# Patient Record
Sex: Male | Born: 1995 | Race: Black or African American | Hispanic: No | Marital: Single | Smoking: Current every day smoker
Health system: Southern US, Community
[De-identification: ages and names within clinical notes are randomized; demographics above are authoritative.]

## PROBLEM LIST (undated history)

## (undated) ENCOUNTER — Emergency Department: Admission: EM | Discharge status: Absent without leave | Payer: Self-pay

---
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
## 2015-03-20 ENCOUNTER — Encounter: Payer: Self-pay | Admitting: Emergency Medicine

## 2015-03-20 ENCOUNTER — Inpatient Hospital Stay
Admit: 2015-03-20 | Discharge: 2015-03-22 | DRG: 897 | Disposition: A | Discharge status: Home / Self Care | Payer: No Typology Code available for payment source | Source: Intra-hospital | Attending: Psychiatry | Admitting: Psychiatry

## 2015-03-20 ENCOUNTER — Emergency Department
Admission: EM | Admit: 2015-03-20 | Discharge: 2015-03-20 | Disposition: A | Discharge status: Other Acute Inpatient Hospital | Payer: No Typology Code available for payment source | Attending: Emergency Medicine | Admitting: Emergency Medicine

## 2015-03-20 DIAGNOSIS — F17200 Nicotine dependence, unspecified, uncomplicated: Secondary | ICD-10-CM | POA: Diagnosis present

## 2015-03-20 DIAGNOSIS — R451 Restlessness and agitation: Secondary | ICD-10-CM | POA: Diagnosis present

## 2015-03-20 DIAGNOSIS — Z720 Tobacco use: Secondary | ICD-10-CM | POA: Insufficient documentation

## 2015-03-20 DIAGNOSIS — F19959 Other psychoactive substance use, unspecified with psychoactive substance-induced psychotic disorder, unspecified: Principal | ICD-10-CM | POA: Diagnosis present

## 2015-03-20 DIAGNOSIS — F1220 Cannabis dependence, uncomplicated: Secondary | ICD-10-CM | POA: Diagnosis present

## 2015-03-20 DIAGNOSIS — Z7289 Other problems related to lifestyle: Secondary | ICD-10-CM | POA: Diagnosis not present

## 2015-03-20 DIAGNOSIS — Z638 Other specified problems related to primary support group: Secondary | ICD-10-CM | POA: Diagnosis not present

## 2015-03-20 DIAGNOSIS — F29 Unspecified psychosis not due to a substance or known physiological condition: Secondary | ICD-10-CM | POA: Insufficient documentation

## 2015-03-20 DIAGNOSIS — G4700 Insomnia, unspecified: Secondary | ICD-10-CM | POA: Diagnosis present

## 2015-03-20 DIAGNOSIS — F2081 Schizophreniform disorder: Secondary | ICD-10-CM | POA: Diagnosis present

## 2015-03-20 DIAGNOSIS — F19951 Other psychoactive substance use, unspecified with psychoactive substance-induced psychotic disorder with hallucinations: Secondary | ICD-10-CM | POA: Diagnosis present

## 2015-03-20 DIAGNOSIS — F1210 Cannabis abuse, uncomplicated: Secondary | ICD-10-CM | POA: Insufficient documentation

## 2015-03-20 DIAGNOSIS — R440 Auditory hallucinations: Secondary | ICD-10-CM | POA: Diagnosis present

## 2015-03-20 LAB — ETHANOL: Alcohol, Ethyl (B): <5 mg/dL (ref <5)

## 2015-03-20 LAB — CBC
HCT: 47.8 % (ref 40.0–52.0)
HEMOGLOBIN: 16 g/dL (ref 13.0–18.0)
MCH: 28.5 pg (ref 26.0–34.0)
MCHC: 33.5 g/dL (ref 32.0–36.0)
MCV: 85.2 fL (ref 80.0–100.0)
Platelets: 272 10*3/uL (ref 150–440)
RBC: 5.61 MIL/uL (ref 4.40–5.90)
RDW: 14.2 % (ref 11.5–14.5)
WBC: 5.4 10*3/uL (ref 3.8–10.6)

## 2015-03-20 LAB — COMPREHENSIVE METABOLIC PANEL
ALK PHOS: 57 U/L (ref 38–126)
ALT: 19 U/L (ref 17–63)
AST: 26 U/L (ref 15–41)
Albumin: 4.9 g/dL (ref 3.5–5.0)
Anion gap: 11 (ref 5–15)
BUN: 11 mg/dL (ref 6–20)
CHLORIDE: 102 mmol/L (ref 101–111)
CO2: 27 mmol/L (ref 22–32)
CREATININE: 0.91 mg/dL (ref 0.61–1.24)
Calcium: 9.6 mg/dL (ref 8.9–10.3)
Glucose, Bld: 77 mg/dL (ref 65–99)
Potassium: 3.8 mmol/L (ref 3.5–5.1)
Sodium: 140 mmol/L (ref 135–145)
Total Bilirubin: 0.9 mg/dL (ref 0.3–1.2)
Total Protein: 8.1 g/dL (ref 6.5–8.1)

## 2015-03-20 LAB — ACETAMINOPHEN LEVEL: Acetaminophen (Tylenol), Serum: <10 ug/mL — ABNORMAL LOW (ref 10–30)

## 2015-03-20 LAB — URINE DRUG SCREEN, QUALITATIVE (ARMC ONLY)
Amphetamines, Ur Screen: NOT DETECTED
Barbiturates, Ur Screen: NOT DETECTED
Benzodiazepine, Ur Scrn: NOT DETECTED
CANNABINOID 50 NG, UR ~~LOC~~: NOT DETECTED
COCAINE METABOLITE, UR ~~LOC~~: NOT DETECTED
MDMA (ECSTASY) UR SCREEN: NOT DETECTED
Methadone Scn, Ur: NOT DETECTED
Opiate, Ur Screen: NOT DETECTED
PHENCYCLIDINE (PCP) UR S: NOT DETECTED
TRICYCLIC, UR SCREEN: NOT DETECTED

## 2015-03-20 LAB — SALICYLATE LEVEL: Salicylate Lvl: <4 mg/dL (ref 2.8–30.0)

## 2015-03-20 NOTE — ED Notes (Signed)
Pt here with IVC papers, denies any SI, HI or hallucinations. IVC papers state pt lays in street, talks to himself, thinks he is on drugs. Pt denies.

## 2015-03-20 NOTE — ED Notes (Signed)
Pt. Noted in rest room. No complaints or concerns voiced. No distress or abnormal behavior noted. Will continue to monitor with security cameras. Q 15 minute rounds continue.  

## 2015-03-20 NOTE — BH Assessment (Signed)
Assessment Note  Dylan KaufmannJamal M Roy is an 19 y.o. male. He states "I was brought by the police". He states that his grand mother believed he needed to be here.  He reports that he was a sleep all day. He reports that his grandmother stated that he was talking to himself and doing things out of the ordinary. He denied symptoms of depression or anxiety. He reports occasionally he has hallucinations, but then looked away and changed his answer. He denied having homicidal or suicidal ideation or intent. He denied use of alchol. He reports the use of marijuana.  His IVC paperwork states that he "Lays in the street, his mother thinks he is on drugs, He cannot keep a job, talks to himself". He appears to be responding to internal stimuli, looking away, listening and laughing at inapporpriate times.  Diagnosis:  Past Medical History: History reviewed. No pertinent past medical history.  History reviewed. No pertinent past surgical history.  Family History: History reviewed. No pertinent family history.  Social History:  reports that he has been smoking.  He does not have any smokeless tobacco history on file. He reports that he drinks alcohol. He reports that he uses illicit drugs (Marijuana).  Additional Social History:  Alcohol / Drug Use History of alcohol / drug use?: Yes Negative Consequences of Use:  (Denied any negative consequences) Substance #1 Name of Substance 1: Marijuana 1 - Age of First Use: 14 1 - Amount (size/oz): 1-2 blunts 1 - Frequency: every couple of days 1 - Last Use / Amount: 03/17/2015  CIWA: CIWA-Ar Pulse Rate: 60 COWS:    Allergies: No Known Allergies  Home Medications:  (Not in a hospital admission)  OB/GYN Status:  No LMP for male patient.  General Assessment Data [facility] Assessment: In system Is this a Tele or Face-to-Face Assessment?: Face-to-Face Is this an Initial Assessment or a Re-assessment for this encounter?: Initial  Assessment Marital status: Single Maiden name: n/a Is patient pregnant?: No Pregnancy Status: No Living Arrangements: Other relatives Database administrator(Grandmother) Can pt return to current living arrangement?: Yes Admission Status: Involuntary Is patient capable of signing voluntary admission?: Yes Referral Source: Self/Family/Friend Insurance type: None  Medical Screening Exam [facility]-in ONLY) Medical Exam completed: Yes  Crisis Care Plan Living Arrangements: Other relatives Database administrator(Grandmother) Name of Psychiatrist: None Name of Therapist: None  Education Status Is patient currently in school?: No Current Grade: N/a Highest grade of school patient has completed: 11th Name of school: [employer] Contact person: N/a  Risk to self with the past 6 months Suicidal Ideation: No Has patient been a risk to self within the past 6 months prior to admission? : No Suicidal Intent: No Has patient had any suicidal intent within the past 6 months prior to admission? : No Is patient at risk for suicide?: No Suicidal Plan?: No Has patient had any suicidal plan within the past 6 months prior to admission? : No Access to Means: No What has been your use of drugs/alcohol within the last 12 months?: Use of marijuana Previous Attempts/Gestures: No How many times?: 0 Other Self Harm Risks: None reported Triggers for Past Attempts: None known Intentional Self Injurious Behavior: None Family Suicide History: No Recent stressful life event(s):  (None reported) Persecutory voices/beliefs?: No Depression: No Depression Symptoms:  (None reported) Substance abuse history and/or treatment for substance abuse?: Yes Suicide prevention information given to non-admitted patients: Not applicable  Risk to Others within the past 6 months Homicidal Ideation: No  Does patient have any lifetime risk of violence toward others beyond the six months prior to admission? : No Thoughts of Harm to Others: No Current Homicidal  Intent: No Current Homicidal Plan: No Access to Homicidal Means: No Identified Victim: None reported History of harm to others?: No Assessment of Violence: None Noted Violent Behavior Description: None reported Does patient have access to weapons?: No Criminal Charges Pending?: No Does patient have a court date: No Is patient on probation?: No  Psychosis Hallucinations:  (Unsure, states he does but changed his mind) Delusions: None noted  Mental Status Report Appearance/Hygiene: In scrubs, Unremarkable Eye Contact: Poor Motor Activity: Restlessness Speech: Soft, Logical/coherent Level of Consciousness: Alert Mood: Euthymic Affect: Unable to Assess Anxiety Level: None Thought Processes: Coherent Judgement: Partial Orientation: Person, Place, Situation Obsessive Compulsive Thoughts/Behaviors: None  Cognitive Functioning Concentration: Decreased Memory: Recent Intact IQ: Average Insight: Poor Impulse Control: Fair Appetite: Good Sleep: No Change Vegetative Symptoms: Staying in bed  ADLScreening [facility] Assessment Services) Patient's cognitive ability adequate to safely complete daily activities?: Yes Patient able to express need for assistance with ADLs?: Yes Independently performs ADLs?: Yes (appropriate for developmental age)  Prior Inpatient Therapy Prior Inpatient Therapy: No  Prior Outpatient Therapy Prior Outpatient Therapy: No Does patient have an ACCT team?: No Does patient have Intensive In-House Services?  : No Does patient have [facility] services? : No Does patient have P4CC services?: No  ADL Screening (condition at time of admission) Patient's cognitive ability adequate to safely complete daily activities?: Yes Patient able to express need for assistance with ADLs?: Yes Independently performs ADLs?: Yes (appropriate for developmental age)       Abuse/Neglect Assessment (Assessment to be complete while patient is alone) Physical Abuse:  Denies Verbal Abuse: Denies Sexual Abuse: Denies Exploitation of patient/patient's resources: Denies Self-Neglect: Denies Values / Beliefs Cultural Requests During Hospitalization: None Spiritual Requests During Hospitalization: None   Advance Directives (For Healthcare) Does patient have an advance directive?: No Would patient like information on creating an advanced directive?: No - patient declined information    Additional Information 1:1 In Past 12 Months?: No CIRT Risk: No Elopement Risk: No Does patient have medical clearance?: Yes     Disposition:  Disposition Initial Assessment Completed for this Encounter: Yes Disposition of Patient: Inpatient treatment program Type of inpatient treatment program: Adult  On Site Evaluation by:   Reviewed with Physician:    Justice Deeds 03/20/2015 8:46 PM

## 2015-03-20 NOTE — ED Notes (Signed)
Pt to ER under IVC by mother. Per IVC papers pt laying in street, mother thinks pt is doing drugs, and that pt talks to himself. Pt denies SI, HI, ETOH use. Pt does report smoking cigarettes 2-3 cigarettes/day, pt reprots smoking marijuana approximately 5 x/month. Denies other illicit drugs.  Pt does report AH that have been increasing over the past year. Pt reprots having VH since he was a child, but the hallucinations have become "scarier" during the past year.

## 2015-03-20 NOTE — ED Notes (Signed)
Pt. Noted in room. No complaints or concerns voiced. No distress or abnormal behavior noted. Will continue to monitor with security cameras. Q 15 minute rounds continue. 

## 2015-03-20 NOTE — ED Notes (Signed)
Pt placed in subwait with BPD at bedside.

## 2015-03-20 NOTE — ED Notes (Signed)

## 2015-03-20 NOTE — ED Notes (Signed)
[facility] Jillyn HiddenGary L.

## 2015-03-20 NOTE — ED Provider Notes (Signed)
[facility] Emergency Department Provider Note  ____________________________________________  Time seen: Approximately 8 PM  I have reviewed the triage vital signs and the nursing notes.   HISTORY  Chief Complaint Behavior Problem    HPI Cathan Gearin Mandigo is a 19 y.o. male who was committed by his grandmother tonight for odd behavior such as lying in the street and talking to himself. The patient denies any suicidal or homicidal ideation. He says that he does hear voices but is not able to say exactly what they say to him. He does say that he has conversations with them. Does admit to occasional marijuana use but denies any other illicit substance.   History reviewed. No pertinent past medical history.  Patient Active Problem List   Diagnosis Date Noted  . 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 Marijuana abuse 03/20/2015    History reviewed. No pertinent past surgical history.  No current outpatient prescriptions on file.  Allergies Review of patient's allergies indicates no known allergies.  History reviewed. No pertinent family history.  Social History Social History  Substance Use Topics  . Smoking status: Current Every Day Smoker  . Smokeless tobacco: None  . Alcohol Use: Yes    Review of Systems Constitutional: No fever/chills Eyes: No visual changes. ENT: No sore throat. Cardiovascular: Denies chest pain. Respiratory: Denies shortness of breath. Gastrointestinal: No abdominal pain.  No nausea, no vomiting.  No diarrhea.  No constipation. Genitourinary: Negative for dysuria. Musculoskeletal: Negative for back pain. Skin: Negative for rash. Neurological: Negative for headaches, focal weakness or numbness.  10-point ROS otherwise negative.  ____________________________________________   PHYSICAL EXAM:  VITAL SIGNS: ED Triage Vitals  Enc Vitals Group     BP --      Pulse Rate 03/20/15 1802 60     Resp 03/20/15 1802  16     Temp 03/20/15 1802 98 F (36.7 C)     Temp Source 03/20/15 1802 Oral     SpO2 03/20/15 1802 100 %     Weight 03/20/15 1802 152 lb (68.947 kg)     Height 03/20/15 1802  (1.676 m)     Head Cir --      Peak Flow --      Pain Score 03/20/15 1801 0     Pain Loc --      Pain Edu? --      Excl. in GC? --     Constitutional: Alert and oriented. Well appearing and in no acute distress. Eyes: Conjunctivae are normal. PERRL. EOMI. Head: Atraumatic. Nose: No congestion/rhinnorhea. Mouth/Throat: Mucous membranes are moist.  Oropharynx non-erythematous. Neck: No stridor.   Cardiovascular: Normal rate, regular rhythm. Grossly normal heart sounds.  Good peripheral circulation. Respiratory: Normal respiratory effort.  No retractions. Lungs CTAB. Gastrointestinal: Soft and nontender. No distention. No abdominal bruits. No CVA tenderness. Musculoskeletal: No lower extremity tenderness nor edema.  No joint effusions. Neurologic:  Normal speech and language. No gross focal neurologic deficits are appreciated. No gait instability. Skin:  Skin is warm, dry and intact. No rash noted. Psychiatric: Odd affect. Laughing when talking about hearing voices.   ____________________________________________   LABS (all labs ordered are listed, but only abnormal results are displayed)  Labs Reviewed  ACETAMINOPHEN LEVEL - Abnormal; Notable for the following:    Acetaminophen (Tylenol), Serum <10 (*)    All other components within normal limits  COMPREHENSIVE METABOLIC PANEL  ETHANOL  SALICYLATE LEVEL  CBC  URINE DRUG SCREEN, QUALITATIVE ([facility] ONLY)  ____________________________________________  EKG   ____________________________________________  RADIOLOGY   ____________________________________________   PROCEDURES  ____________________________________________   INITIAL IMPRESSION / ASSESSMENT AND PLAN / ED COURSE  Pertinent labs & imaging results that were available during  my care of the patient were reviewed by me and considered in my medical decision making (see chart for details).  ----------------------------------------- 8:34 PM on 03/20/2015 -----------------------------------------  Patient evaluated by Dr. Toni Amendlapacs who is recommending admission to the hospital. ____________________________________________   FINAL CLINICAL IMPRESSION(S) / ED DIAGNOSES  Psychosis     Myrna Blazeravid Matthew Carnell Casamento, MD 03/20/15 2034

## 2015-03-20 NOTE — Consult Note (Signed)
[facility] Consult   Reason for Consult:  Consult for this 19 year old man brought in under involuntary petition taken out by his family Referring Physician:  Clearnce Hasten Patient Identification: Dylan Roy MRN:  [id number] Principal Diagnosis: Schizophreniform disorder [facility]) Diagnosis:   Patient Active Problem List   Diagnosis Date Noted  . 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 Marijuana abuse [F12.10] 03/20/2015    Total Time spent with patient: 45 minutes  Subjective:   Dylan Roy is a 19 y.o. male patient admitted with "I've been hearing some things".  HPI:  Information from the patient and the chart. The referral commitment says that the patient has been hearing voices and talking to himself and lying down in the street. The patient admits that he was lying down in a parking lot. He doesn't have any particular reason for this. He says that he was talking to himself. When pressed he admits that he has been having auditory hallucinations of been going on for several months. He will go into any more detail with me describing them. Says they happen about every other day and sometimes he talks back to them. Patient is not sleeping well at night and instead sleeps during the day. Admits that he uses marijuana a few times a week. Denies using any other drugs. Not currently getting any psychiatric treatment. Patient is very limited in offering any history to me.  Social history: Patient is not in school not working. Lives with his grandmother. Sounds again has a pretty limited social life.  Medical history: No significant medical problems known.  Substance abuse history: Marijuana abuse a few times a week denies other drugs denies alcohol.  Current medications: None  Past Psychiatric History: Patient was treated for attention deficit hyperactivity disorder as a child. Hasn't taken any medicine in several years. No history of suicide attempts no  history of violence no history of psychiatric hospitalization.  Risk to Self: Is patient at risk for suicide?: No Risk to Others:   Prior Inpatient Therapy:   Prior Outpatient Therapy:    Past Medical History: History reviewed. No pertinent past medical history. History reviewed. No pertinent past surgical history. Family History: History reviewed. No pertinent family history. Family Psychiatric  History: Patient denies any family history of mental health or substance abuse problems. Social History:  History  Alcohol Use  . Yes     History  Drug Use  . Yes  . Special: Marijuana    Social History   Social History  . Marital Status: Single    Spouse Name: N/A  . Number of Children: N/A  . Years of Education: N/A   Social History Main Topics  . Smoking status: Current Every Day Smoker  . Smokeless tobacco: None  . Alcohol Use: Yes  . Drug Use: Yes    Special: Marijuana  . Sexual Activity: Not Asked   Other Topics Concern  . None   Social History Narrative  . None   Additional Social History:                          Allergies:  No Known Allergies  Labs:  Results for orders placed or performed during the hospital encounter of 03/20/15 (from the past 48 hour(s))  Comprehensive metabolic panel     Status: None   Collection Time: 03/20/15  6:05 PM  Result Value Ref Range   Sodium 140 135 - 145 mmol/L   Potassium  3.8 3.5 - 5.1 mmol/L   Chloride 102 101 - 111 mmol/L   CO2 27 22 - 32 mmol/L   Glucose, Bld 77 65 - 99 mg/dL   BUN 11 6 - 20 mg/dL   Creatinine, Ser 0.91 0.61 - 1.24 mg/dL   Calcium 9.6 8.9 - 10.3 mg/dL   Total Protein 8.1 6.5 - 8.1 g/dL   Albumin 4.9 3.5 - 5.0 g/dL   AST 26 15 - 41 U/L   ALT 19 17 - 63 U/L   Alkaline Phosphatase 57 38 - 126 U/L   Total Bilirubin 0.9 0.3 - 1.2 mg/dL   GFR calc non Af Amer >60 >60 mL/min   GFR calc Af Amer >60 >60 mL/min    Comment: (NOTE) The eGFR has been calculated using the CKD EPI equation. This  calculation has not been validated in all clinical situations. eGFR's persistently <60 mL/min signify possible Chronic Kidney Disease.    Anion gap 11 5 - 15  Ethanol (ETOH)     Status: None   Collection Time: 03/20/15  6:05 PM  Result Value Ref Range   Alcohol, Ethyl (B) <5 <5 mg/dL    Comment:        LOWEST DETECTABLE LIMIT FOR SERUM ALCOHOL IS 5 mg/dL FOR MEDICAL PURPOSES ONLY   Salicylate level     Status: None   Collection Time: 03/20/15  6:05 PM  Result Value Ref Range   Salicylate Lvl <0.2 2.8 - 30.0 mg/dL  Acetaminophen level     Status: Abnormal   Collection Time: 03/20/15  6:05 PM  Result Value Ref Range   Acetaminophen (Tylenol), Serum <10 (L) 10 - 30 ug/mL    Comment:        THERAPEUTIC CONCENTRATIONS VARY SIGNIFICANTLY. A RANGE OF 10-30 ug/mL MAY BE AN EFFECTIVE CONCENTRATION FOR MANY PATIENTS. HOWEVER, SOME ARE BEST TREATED AT CONCENTRATIONS OUTSIDE THIS RANGE. ACETAMINOPHEN CONCENTRATIONS >150 ug/mL AT 4 HOURS AFTER INGESTION AND >50 ug/mL AT 12 HOURS AFTER INGESTION ARE OFTEN ASSOCIATED WITH TOXIC REACTIONS.   CBC     Status: None   Collection Time: 03/20/15  6:05 PM  Result Value Ref Range   WBC 5.4 3.8 - 10.6 K/uL   RBC 5.61 4.40 - 5.90 MIL/uL   Hemoglobin 16.0 13.0 - 18.0 g/dL   HCT 47.8 40.0 - 52.0 %   MCV 85.2 80.0 - 100.0 fL   MCH 28.5 26.0 - 34.0 pg   MCHC 33.5 32.0 - 36.0 g/dL   RDW 14.2 11.5 - 14.5 %   Platelets 272 150 - 440 K/uL  Urine Drug Screen, Qualitative ([facility] only)     Status: None   Collection Time: 03/20/15  6:05 PM  Result Value Ref Range   Tricyclic, Ur Screen NONE DETECTED NONE DETECTED   Amphetamines, Ur Screen NONE DETECTED NONE DETECTED   MDMA (Ecstasy)Ur Screen NONE DETECTED NONE DETECTED   Cocaine Metabolite,Ur [place] NONE DETECTED NONE DETECTED   Opiate, Ur Screen NONE DETECTED NONE DETECTED   Phencyclidine (PCP) Ur S NONE DETECTED NONE DETECTED   Cannabinoid 50 Ng, Ur [place] NONE DETECTED NONE DETECTED   Barbiturates, Ur  Screen NONE DETECTED NONE DETECTED   Benzodiazepine, Ur Scrn NONE DETECTED NONE DETECTED   Methadone Scn, Ur NONE DETECTED NONE DETECTED    Comment: (NOTE) 542  Tricyclics, urine               Cutoff 1000 ng/mL 200  Amphetamines, urine  Cutoff 1000 ng/mL 300  MDMA (Ecstasy), urine           Cutoff 500 ng/mL 400  Cocaine Metabolite, urine       Cutoff 300 ng/mL 500  Opiate, urine                   Cutoff 300 ng/mL 600  Phencyclidine (PCP), urine      Cutoff 25 ng/mL 700  Cannabinoid, urine              Cutoff 50 ng/mL 800  Barbiturates, urine             Cutoff 200 ng/mL 900  Benzodiazepine, urine           Cutoff 200 ng/mL 1000 Methadone, urine                Cutoff 300 ng/mL 1100 1200 The urine drug screen provides only a preliminary, unconfirmed 1300 analytical test result and should not be used for non-medical 1400 purposes. Clinical consideration and professional judgment should 1500 be applied to any positive drug screen result due to possible 1600 interfering substances. A more specific alternate chemical method 1700 must be used in order to obtain a confirmed analytical result.  1800 Gas chromato graphy / mass spectrometry (GC/MS) is the preferred 1900 confirmatory method.     No current facility-administered medications for this encounter.   No current outpatient prescriptions on file.    Musculoskeletal: Strength & Muscle Tone: within normal limits Gait & Station: normal Patient leans: N/A  Psychiatric Specialty Exam: Review of Systems  Constitutional: Negative.   HENT: Negative.   Eyes: Negative.   Respiratory: Negative.   Cardiovascular: Negative.   Gastrointestinal: Negative.   Musculoskeletal: Negative.   Skin: Negative.   Neurological: Negative.   Psychiatric/Behavioral: Negative for depression, suicidal ideas, hallucinations, memory loss and substance abuse. The patient has insomnia. The patient is not nervous/anxious.     Pulse 60,  temperature 98 F (36.7 C), temperature source Oral, resp. rate 16, height '5\' 6"'  (1.676 m), weight 68.947 kg (152 lb), SpO2 100 %.Body mass index is 24.55 kg/(m^2).  General Appearance: Casual  Eye Contact::  Minimal  Speech:  Slow  Volume:  Decreased  Mood:  Negative  Affect:  Congruent  Thought Process:  Goal Directed  Orientation:  Full (Time, Place, and Person)  Thought Content:  Hallucinations: Auditory  Suicidal Thoughts:  No  Homicidal Thoughts:  No  Memory:  Immediate;   Fair Recent;   Poor Remote;   Poor  Judgement:  Impaired  Insight:  Shallow  Psychomotor Activity:  Decreased  Concentration:  Fair  Recall:  [place]: Fair  Akathisia:  No  Handed:  Right  AIMS (if indicated):     Assets:  Communication Skills Housing Physical Health Social Support  ADL's:  Intact  Cognition: WNL  Sleep:      Treatment Plan Summary: Plan Patient will be admitted into the psychiatric ward of the hospital. There is enough evidence here that he is having psychotic symptoms to make it reasonable to go ahead and admit him for further evaluation. Patient is denying any suicidal ideation. Drug screen negative nothing remarkable on the labs. Counseling completed. Reviewed case with emergency room physician.  Disposition: Recommend psychiatric Inpatient admission when medically cleared. Supportive therapy provided about ongoing stressors.  John Clapacs 03/20/2015 8:25 PM

## 2015-03-20 NOTE — ED Notes (Signed)
Pt in room. No complaints or concerns voiced at this time. No abnormal behavior noted at this time. Will continue to monitor with q15 min checks. ODS officer in area. 

## 2015-03-20 NOTE — ED Notes (Signed)
Pt. To [facility] from ED ambulatory without difficulty, to [facility] 1 . [facility] Pt. Is alert and oriented, warm and dry in no distress. Pt. Denies SI, HI, and AVH. Pt. Calm and cooperative. Pt. Made aware of security cameras and Q15 minute rounds. Pt. Encouraged to let Nursing staff know of any concerns or needs.

## 2015-03-21 DIAGNOSIS — F17200 Nicotine dependence, unspecified, uncomplicated: Secondary | ICD-10-CM | POA: Diagnosis present

## 2015-03-21 DIAGNOSIS — F2081 Schizophreniform disorder: Secondary | ICD-10-CM

## 2015-03-21 LAB — LIPID PANEL
Cholesterol: 150 mg/dL (ref 0–200)
HDL: 45 mg/dL (ref >40)
LDL Cholesterol: 77 mg/dL (ref 0–99)
Total CHOL/HDL Ratio: 3.3 RATIO
Triglycerides: 138 mg/dL (ref <150)
VLDL: 28 mg/dL (ref 0–40)

## 2015-03-21 LAB — HEMOGLOBIN A1C: HEMOGLOBIN A1C: 5.1 % (ref 4.0–6.0)

## 2015-03-21 LAB — TSH: TSH: 4.932 u[IU]/mL — ABNORMAL HIGH (ref 0.350–4.500)

## 2015-03-21 MED ORDER — NICOTINE 21 MG/24HR TD PT24: 21.0000 mg | MEDICATED_PATCH | Freq: Every day | TRANSDERMAL | Status: DC

## 2015-03-21 MED ORDER — TRAZODONE HCL 100 MG PO TABS
100.0000 mg | ORAL_TABLET | Freq: Every day | ORAL | Status: DC
  Filled 2015-03-21: qty 1

## 2015-03-21 MED ORDER — ARIPIPRAZOLE 5 MG PO TABS: 5.0000 mg | ORAL_TABLET | Freq: Every day | ORAL | Status: DC

## 2015-03-21 MED ORDER — MAGNESIUM HYDROXIDE 400 MG/5ML PO SUSP: 30.0000 mL | Freq: Every day | ORAL | Status: DC | PRN

## 2015-03-21 MED ORDER — ALUM & MAG HYDROXIDE-SIMETH 200-200-20 MG/5ML PO SUSP: 30.0000 mL | ORAL | Status: DC | PRN

## 2015-03-21 MED ORDER — ACETAMINOPHEN 325 MG PO TABS: 650.0000 mg | ORAL_TABLET | Freq: Four times a day (QID) | ORAL | Status: DC | PRN

## 2015-03-21 NOTE — Progress Notes (Signed)
Recreation Therapy Notes  Date: 11.08.16 Time: 3:00 pm Location: Craft Room  Group Topic: Goal Setting  Goal Area(s) Addresses:  Patient will write down at least one goal. Patient will write down at least one obstacle.  Behavioral Response: Did not attend  Intervention: Recovery Goal Chart  Activity: Patients were instructed to make a Recovery Goal Chart with goals, obstacles, the date they started working on their goals, and the date they achieved their goals.   Education: LRT educated patients on healthy ways they can celebrate reaching their goals.  Education Outcome: Patient did not attend group.  Clinical Observations/Feedback: Patient did not attend group.  Jacquelynn CreeGreene,Josephene Marrone M, LRT/CTRS 03/21/2015 4:08 PM

## 2015-03-21 NOTE — [facility] Group Notes (Signed)
[facility]  03/21/2015 2:18 PM  Type of Therapy:  Group Therapy  Participation Level:  Did Not Attend   Lulu Ridingngle, Neena Beecham T, MSW, LCSWA 03/21/2015, 2:18 PM

## 2015-03-21 NOTE — Progress Notes (Signed)
Patient with bright affect, smiling and talking with peers. Good appetite and good adls. Patient to am therapy group and smiling and laughing to himself. Patient appears to be responding to internal stimuli. Reported to Clinical research associatewriter that patient attempted to lead group by asking peers " how are you feeling?". Reported to Clinical research associatewriter that patient was overhead stating" I am leaving today and if the Doctor says No I am walking out the door and if anyone touches me while I'm leaving I will sue the hospital". One on one with patient and patient does appear to be responding to internal stimuli during assessment. He is noted to look at chair next to him and wait. Delays response during discussion. Attempts to explain current recommended plan of care with little effect. Patient focused on what staff should do for other patients who may need help. Patient states he "is fine and does not belong here". Denies SI/HI/AVH at this time.

## 2015-03-21 NOTE — [facility] Suicide Risk Assessment (Signed)
[facility] Admission Suicide Risk Assessment   Nursing information obtained from:  Patient Demographic factors:  Adolescent or young adult, Unemployed, Male Current Mental Status:  NA Loss Factors:  NA Historical Factors:  NA Risk Reduction Factors:  Living with another person, especially a relative Total Time spent with patient: 1 hour Principal Problem: Schizophreniform disorder (HCC) Diagnosis:   Patient Active Problem List   Diagnosis Date Noted  . Tobacco use disorder [F17.200] 03/21/2015  . 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 Cannabis use disorder, moderate, dependence (HCC) [F12.20] 03/20/2015     Continued Clinical Symptoms:  Alcohol Use Disorder Identification Test Final Score (AUDIT): 0 The "Alcohol Use Disorders Identification Test", Guidelines for Use in Primary Care, Second Edition.  World Science writer [facility]). Score between 0-7:  no or low risk or alcohol related problems. Score between 8-15:  moderate risk of alcohol related problems. Score between 16-19:  high risk of alcohol related problems. Score 20 or above:  warrants further diagnostic evaluation for alcohol dependence and treatment.   CLINICAL FACTORS:   Alcohol/Substance Abuse/Dependencies Schizophrenia:   Command hallucinatons Less than 73 years old Paranoid or undifferentiated type   Musculoskeletal: Strength & Muscle Tone: within normal limits Gait & Station: normal Patient leans: N/A  Psychiatric Specialty Exam: I reviewed physical exam performed in the emergency room and they agree with the findings Physical Exam  Nursing note and vitals reviewed.   Review of Systems  Psychiatric/Behavioral: Positive for hallucinations and substance abuse.  All other systems reviewed and are negative.   Blood pressure 113/69, pulse 64, temperature 98 F (36.7 C), temperature source Oral, resp. rate 18, height  (1.676 m), weight 66.906 kg (147 lb 8 oz), SpO2 100 %.Body mass index is  23.82 kg/(m^2).  General Appearance: Bizarre  Eye Contact::  Minimal  Speech:  Normal Rate  Volume:  Increased  Mood:  Irritable  Affect:  Labile  Thought Process:  Disorganized  Orientation:  Full (Time, Place, and Person)  Thought Content:  Delusions, Hallucinations: Auditory and Paranoid Ideation  Suicidal Thoughts:  No  Homicidal Thoughts:  No  Memory:  Immediate;   Fair Recent;   Fair Remote;   Fair  Judgement:  Poor  Insight:  Lacking  Psychomotor Activity:  Normal  Concentration:  Fair  Recall:  [employer] of Knowledge:Fair  Language: Fair  Akathisia:  No  Handed:  Right  AIMS (if indicated):     Assets:  Communication Skills  Sleep:  Number of Hours: 0  Cognition: WNL  ADL's:  Intact     COGNITIVE FEATURES THAT CONTRIBUTE TO RISK:  Closed-mindedness    SUICIDE RISK:   Moderate:  Frequent suicidal ideation with limited intensity, and duration, some specificity in terms of plans, no associated intent, good self-control, limited dysphoria/symptomatology, some risk factors present, and identifiable protective factors, including available and accessible social support.  PLAN OF CARE: Hospital admission, medication management, substance abuse counseling, discharge planning.  Medical Decision Making:  New problem, with additional work up planned, Review of Psycho-Social Stressors (1), Review or order clinical lab tests (1), Review of Medication Regimen & Side Effects (2) and Review of New Medication or Change in Dosage (2)   [name] is a 19 year old male with new onset psychosis admitted for bizarre behavior and auditory hallucinations.  1. Psychosis. The patient has no insight into his problems. He does not. He belongs in the psychiatric hospital and demands immediate discharge. We offered Abilify but the patient refused.  2. Agitated behavior. When told that he would not be discharged immediately the patient became angry and agitated. Security was called.  3.  Smoking. NicoDerm patch is available but the patient refuses.  4. Insomnia. He did not sleep at all last night but refused to sleeping aid.  5. Disposition. He lives with his grandmother and we are not sure if he will be allowed to return without treatment. He already refused any follow-up with mental health professionals.    I certify that inpatient services furnished can reasonably be expected to improve the patient's condition.   Shamona Wirtz 03/21/2015, 12:44 PM

## 2015-03-21 NOTE — Progress Notes (Signed)
Patient with appropriate affect and cooperative behavior. Less irritable, less angry. Eats evening meal, appropriate interactions with peers. Denies SI/HI/AVH at this time. Safety maintained. Watching tv. Moved to room 315.

## 2015-03-21 NOTE — Plan of Care (Signed)
Problem: [facility] Patient Education Outcome: Progressing Patient demanding discharge this am. Patient slightly more cooperative this afternoon with plan of care. Refuses meds at this time.

## 2015-03-21 NOTE — Progress Notes (Signed)
Writer explained involuntary status, admission process and evaluation period. Denies SI/HI/AVH at this time. States "nothing is wrong with me". Explained current recommended plan of care and that he would meet with MD for evaluation, assessment and discussion. Patient meets with MD and then returns to dayroom. Patient becomes increasingly angry and repeatedly states "let me out the door". One on one with writer with little effect. Charge nurse spends one on one with patient to explain current recommended plan of care with little effect. Patient remains at nurse's station banging on nurse's station window, staring at staff stating "you will have to hear my mouth all day". Patient raises voice in intimidating way when staff tries to discuss his options. Encouraged to take scheduled meds. Encouraged to take prn meds for anxiety and agitation. Charge Nurse, Nurse Manager,MD and Security personal aware. Safety maintained at this time. Patient calmer now, pacing hall and sits in room.

## 2015-03-21 NOTE — Progress Notes (Signed)
Patient admitted IVC after grandmother reported bizarre behavior by patient.  Grandmother states that she thinks patient may be using drugs because he was lying in the road, talks to himself, and cannot keep a job.  Patient reports that he is "fine" and does "not need to be here."  Patient search performed.  No contraband found.

## 2015-03-21 NOTE — H&P (Signed)
Psychiatric Admission Assessment Adult  Patient Identification: Dylan Roy MRN:  [id number] Date of Evaluation:  03/21/2015 Chief Complaint:  schizophrenia form disorder Principal Diagnosis: Schizophreniform disorder (HCC) Diagnosis:   Patient Active Problem List   Diagnosis Date Noted  . Tobacco use disorder [F17.200] 03/21/2015  . 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 Cannabis use disorder, moderate, dependence (HCC) [F12.20] 03/20/2015   History of Present Illness:  Identifying data. Dylan Roy is a 19 year old male with no past psychiatric history.  Chief complaint. "I would not stay here and other night."  History of present illness. Information was obtained from the patient and the chart. The patient adamantly denies any symptoms of depression, anxiety, or psychosis. He has no idea how he ended up in the hospital although he knows that he was committed by his grandmother. According to the petition at the patient has been talking to himself, hallucinating for several months. On the day of admission. He was found laying on the pavement at the parking lot. The grandmother called the police and the patient was brought to the hospital. I was unable to obtain any meaningful information from the patient as he denies any everything. He explained to me that he was down on the pavement because he was tired. He could not tell me why he did not choose [facility]. He does not see any problems in his relationship with her grandma. He is uncertain if he will be able to return to her house. Other than grandmother he has one sister possibly be supportive. He is estranged from the rest of his family for reasons that he does not explain. He admits to smoking marijuana but denies any other substance use. Interestingly he was negative for cannabinoids on urine drug screen. The patient is actively hallucinating during the interview at turning his head away to listen to these  voices. There is a great latency of response. His thoughts are disorganized. He is laughing and giggling inappropriately. And the end of the interview he stormed out of the room threatening to leave the unit and hurt staff.  Past psychiatric history. He has never been hospitalized. No suicide attempts.  Family psychiatric history. Nonreported.  Social history. He was expelled from school in the 11th grade for causing trouble. The patient was not specific. He claims to have his GED's. He is currently unemployed and lives with his grandmother and he tells me that he does all the jobs to pay for his cigarettes  he does not have health insurance. There is limited social support.  Total Time spent with patient: 1 hour  Past Psychiatric History: None  Risk to Self: Is patient at risk for suicide?: No Risk to Others:   Prior Inpatient Therapy:   Prior Outpatient Therapy:    Alcohol Screening: 1. How often do you have a drink containing alcohol?: Never 9. Have you or someone else been injured as a result of your drinking?: No 10. Has a relative or friend or a doctor or another health worker been concerned about your drinking or suggested you cut down?: No Alcohol Use Disorder Identification Test Final Score (AUDIT): 0 Brief Intervention: AUDIT score less than 7 or less-screening does not suggest unhealthy drinking-brief intervention not indicated Substance Abuse History in the last 12 months:  Yes.   Consequences of Substance Abuse: Negative Previous Psychotropic Medications: No  Psychological Evaluations: No  Past Medical History: History reviewed. No pertinent past medical history. History reviewed. No pertinent past surgical history. Family  History: History reviewed. No pertinent family history. Family Psychiatric  History: None reported. Social History:  History  Alcohol Use  . Yes     History  Drug Use  . Yes  . Special: Marijuana    Social History   Social History  . Marital  Status: Single    Spouse Name: N/A  . Number of Children: N/A  . Years of Education: N/A   Social History Main Topics  . Smoking status: Current Every Day Smoker  . Smokeless tobacco: None  . Alcohol Use: Yes  . Drug Use: Yes    Special: Marijuana  . Sexual Activity: Not Asked   Other Topics Concern  . None   Social History Narrative   Additional Social History:                         Allergies:  No Known Allergies Lab Results:  Results for orders placed or performed during the hospital encounter of 03/20/15 (from the past 48 hour(s))  Lipid panel, fasting     Status: None   Collection Time: 03/21/15  8:15 AM  Result Value Ref Range   Cholesterol 150 0 - 200 mg/dL   Triglycerides 161138 <096<150 mg/dL   HDL 45 >04>40 mg/dL   Total CHOL/HDL Ratio 3.3 RATIO   VLDL 28 0 - 40 mg/dL   LDL Cholesterol 77 0 - 99 mg/dL    Comment:        Total Cholesterol/HDL:CHD Risk Coronary Heart Disease Risk Table                     Men   Women  1/2 Average Risk   3.4   3.3  Average Risk       5.0   4.4  2 X Average Risk   9.6   7.1  3 X Average Risk  23.4   11.0        Use the calculated Patient Ratio above and the CHD Risk Table to determine the patient's CHD Risk.        ATP III CLASSIFICATION (LDL):  <100     mg/dL   Optimal  540-981100-129  mg/dL   Near or Above                    Optimal  130-159  mg/dL   Borderline  191-478160-189  mg/dL   High  >295>190     mg/dL   Very High   TSH     Status: Abnormal   Collection Time: 03/21/15  8:15 AM  Result Value Ref Range   TSH 4.932 (H) 0.350 - 4.500 uIU/mL    Metabolic Disorder Labs:  No results found for: HGBA1C, MPG No results found for: PROLACTIN Lab Results  Component Value Date   CHOL 150 03/21/2015   TRIG 138 03/21/2015   HDL 45 03/21/2015   CHOLHDL 3.3 03/21/2015   VLDL 28 03/21/2015   LDLCALC 77 03/21/2015    Current Medications: Current Facility-Administered Medications  Medication Dose Route Frequency Provider Last Rate  Last Dose  . acetaminophen (TYLENOL) tablet 650 mg  650 mg Oral Q6H PRN Audery AmelJohn T Clapacs, MD      . alum & mag hydroxide-simeth (MAALOX/MYLANTA) 200-200-20 MG/5ML suspension 30 mL  30 mL Oral Q4H PRN Audery AmelJohn T Clapacs, MD      . ARIPiprazole (ABILIFY) tablet 5 mg  5 mg Oral Daily Shari ProwsJolanta B Ancelmo Hunt, MD  5 mg at 03/21/15 0953  . magnesium hydroxide (MILK OF MAGNESIA) suspension 30 mL  30 mL Oral Daily PRN Audery Amel, MD      . nicotine (NICODERM CQ - dosed in mg/24 hours) patch 21 mg  21 mg Transdermal Daily Tasman Zapata B Jermale Crass, MD   21 mg at 03/21/15 0953  . traZODone (DESYREL) tablet 100 mg  100 mg Oral QHS Lizbet Cirrincione B Darrian Grzelak, MD       PTA Medications: No prescriptions prior to admission    Musculoskeletal: Strength & Muscle Tone: within normal limits Gait & Station: normal Patient leans: N/A  Psychiatric Specialty Exam: Physical Exam  Nursing note and vitals reviewed.   Review of Systems  Psychiatric/Behavioral: Positive for hallucinations and substance abuse. The patient has insomnia.   All other systems reviewed and are negative.   Blood pressure 113/69, pulse 64, temperature 98 F (36.7 C), temperature source Oral, resp. rate 18, height  (1.676 m), weight 66.906 kg (147 lb 8 oz), SpO2 100 %.Body mass index is 23.82 kg/(m^2).  See SRA.                                                  Sleep:  Number of Hours: 0     Treatment Plan Summary: Daily contact with patient to assess and evaluate symptoms and progress in treatment and Medication management    Mr. Holzheimer is a 19 year old male with new onset psychosis admitted for bizarre behavior and auditory hallucinations.  1. Psychosis. The patient has no insight into his problems. He does not. He belongs in the psychiatric hospital and demands immediate discharge. We offered Abilify but the patient refused.  2. Agitated behavior. When told that he would not be discharged immediately the  patient became angry and agitated. Security was called.  3. Smoking. NicoDerm patch is available but the patient refuses.  4. Insomnia. He did not sleep at all last night but refused to sleeping aid.  5. Disposition. He lives with his grandmother and we are not sure if he will be allowed to return without treatment. He already refused any follow-up with mental health professionals.   Observation Level/Precautions:  15 minute checks  Laboratory:  CBC Chemistry Profile UDS UA  Psychotherapy:    Medications:    Consultations:    Discharge Concerns:    Estimated LOS:  Other:     I certify that inpatient services furnished can reasonably be expected to improve the patient's condition.   Shawntia Mangal 11/8/201612:50 PM

## 2015-03-21 NOTE — Tx Team (Signed)
Initial Interdisciplinary Treatment Plan   PATIENT STRESSORS: Substance abuse   PATIENT STRENGTHS: General fund of knowledge Supportive family/friends   PROBLEM LIST: Problem List/Patient Goals Date to be addressed Date deferred Reason deferred Estimated date of resolution  Psychosis 03/21/15     Substance Abuse 03/21/15                                                DISCHARGE CRITERIA:  Improved stabilization in mood, thinking, and/or behavior  PRELIMINARY DISCHARGE PLAN: Outpatient therapy  PATIENT/FAMIILY INVOLVEMENT: This treatment plan has been presented to and reviewed with the patient, Dylan Roy, and/or family member.  The patient and family have been given the opportunity to ask questions and make suggestions.  Gretta ArabHerbin, Urbano Milhouse [facility] 03/21/2015, 12:40 AM

## 2015-03-21 NOTE — [facility] Group Notes (Signed)
[facility] Group Notes:  (Nursing/MHT/Case Management/Adjunct)  Date:  03/21/2015  Time:  2:09 PM  Type of Therapy:  Psychoeducational Skills  Participation Level:  Did Not Attend  Hajar Penninger Lea Campbell 03/21/2015, 2:09 PM 

## 2015-03-21 NOTE — Tx Team (Signed)
Interdisciplinary Treatment Plan Update (Adult)  Date:  03/21/2015 Time Reviewed:  2:14 PM  Progress in Treatment: Attending groups: No. Participating in groups:  No. Taking medication as prescribed:  Yes. Tolerating medication:  Yes. Family/Significant othe contact made:  No, will contact:  if patient provides consent Patient understands diagnosis:  Yes. Discussing patient identified problems/goals with staff:  Yes. Medical problems stabilized or resolved:  Yes. Denies suicidal/homicidal ideation: Yes. Issues/concerns per patient self-inventory:  No. Other:  New problem(s) identified: No, Describe:  none reported  Discharge Plan or Barriers: Patient will discharge home once stabilized on meds for psychosis as patient reports AH and bizarre behaviors. Patient will likely return to his grandmother's and will need mental health follow up at discharge.  Reason for Continuation of Hospitalization: Hallucinations Other; describe bizarre behavior  Comments:  Estimated length of stay:up to 7 days expected discharge Tuesday 03/28/15  New goal(s):  Review of initial/current patient goals per problem list:   1.  Goal(s): participate in care planning  Met:  No  Target date:by discharge  2.  Goal (s):bizarre behaviors will be manageable  Met:  No  Target date:by discharge  3.  Goal(s):AH will be manageable  Met:  No  Target date:by discharge  Attendees: Physician:  Jolanta Pucilowska, MD 11/8/20162:14 PM  Nursing:   Jennifer Morrow, RN 11/8/20162:14 PM  Other:   , LCSWA 11/8/20162:14 PM  Other:   11/8/20162:14 PM  Other:   11/8/20162:14 PM  Other:  11/8/20162:14 PM  Other:  11/8/20162:14 PM  Other:  11/8/20162:14 PM  Other:  11/8/20162:14 PM  Other:  11/8/20162:14 PM  Other:  11/8/20162:14 PM  Other:   11/8/20162:14 PM   Scribe for Treatment Team:   ,  T, MSW, LCSWA  03/21/2015, 2:14 PM  

## 2015-03-21 NOTE — [facility] Group Notes (Signed)
[facility]  03/21/2015 2:59 PM  Type of Therapy:  Group Therapy  Participation Level:  Did Not Attend   Lulu Ridingngle, Fishel Wamble T, MSW, LCSWA 03/21/2015, 2:59 PM

## 2015-03-22 DIAGNOSIS — F2081 Schizophreniform disorder: Secondary | ICD-10-CM | POA: Insufficient documentation

## 2015-03-22 DIAGNOSIS — F19951 Other psychoactive substance use, unspecified with psychoactive substance-induced psychotic disorder with hallucinations: Secondary | ICD-10-CM | POA: Diagnosis present

## 2015-03-22 NOTE — Discharge Summary (Signed)
Physician Discharge Summary Note  Patient:  Dylan Roy is an 19 y.o., male MRN:  [id number] DOB:  [birth year] Patient phone:  [phone] (home)  Patient address:   [address] [place] [address],  Total Time spent with patient: 30 minutes  Date of Admission:  03/20/2015 Date of Discharge: 11/ 01/2015  Reason for Admission:  Psychotic break.   Identifying data. [name] is a 19 year old male with no past psychiatric history.  Chief complaint. "I would not stay here and other night."  History of present illness. Information was obtained from the patient and the chart. The patient adamantly denies any symptoms of depression, anxiety, or psychosis. He has no idea how he ended up in the hospital although he knows that he was committed by his grandmother. According to the petition at the patient has been talking to himself, hallucinating for several months. On the day of admission. He was found laying on the pavement at the parking lot. The grandmother called the police and the patient was brought to the hospital. I was unable to obtain any meaningful information from the patient as he denies any everything. He explained to me that he was down on the pavement because he was tired. He could not tell me why he did not choose [facility]. He does not see any problems in his relationship with her grandma. He is uncertain if he will be able to return to her house. Other than grandmother he has one sister possibly be supportive. He is estranged from the rest of his family for reasons that he does not explain. He admits to smoking marijuana but denies any other substance use. Interestingly he was negative for cannabinoids on urine drug screen. The patient is actively hallucinating during the interview at turning his head away to listen to these voices. There is a great latency of response. His thoughts are disorganized. He is laughing and giggling inappropriately. And the end of the  interview he stormed out of the room threatening to leave the unit and hurt staff.  Past psychiatric history. He has never been hospitalized. No suicide attempts.  Family psychiatric history. Nonreported.  Social history. He was expelled from school in the 11th grade for causing trouble. The patient was not specific. He claims to have his GED's. He is currently unemployed and lives with his grandmother and he tells me that he does all the jobs to pay for his cigarettes he does not have health insurance. There is limited social support.  Priincipal Problem: Substance-induced psychotic disorder with hallucinations [facility]) Discharge Diagnoses: Patient Active Problem List   Diagnosis Date Noted  . 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 Tobacco use disorder [F17.200] 03/21/2015  . Cannabis use disorder, moderate, dependence (HCC) [F12.20] 03/20/2015    Musculoskeletal: Strength & Muscle Tone: within normal limits Gait & Station: normal Patient leans: N/A  Psychiatric Specialty Exam: Physical Exam  Nursing note and vitals reviewed.   Review of Systems  All other systems reviewed and are negative.   Blood pressure 113/69, pulse 64, temperature 98 F (36.7 C), temperature source Oral, resp. rate 18, height  (1.676 m), weight 66.906 kg (147 lb 8 oz), SpO2 100 %.Body mass index is 23.82 kg/(m^2).  See SRA.  Sleep:  Number of Hours: 5.15   Have you used any form of tobacco in the last 30 days? (Cigarettes, Smokeless Tobacco, Cigars, and/or Pipes): Yes  Has this patient used any form of tobacco in the last 30 days? (Cigarettes, Smokeless Tobacco, Cigars, and/or Pipes) Yes, A prescription for an FDA-approved tobacco cessation medication was offered at discharge and the patient refused  Past Medical History: History reviewed. No pertinent past medical history. History  reviewed. No pertinent past surgical history. Family History: History reviewed. No pertinent family history. Social History:  History  Alcohol Use  . Yes     History  Drug Use  . Yes  . Special: Marijuana    Social History   Social History  . Marital Status: Single    Spouse Name: N/A  . Number of Children: N/A  . Years of Education: N/A   Social History Main Topics  . Smoking status: Current Every Day Smoker  . Smokeless tobacco: None  . Alcohol Use: Yes  . Drug Use: Yes    Special: Marijuana  . Sexual Activity: Not Asked   Other Topics Concern  . None   Social History Narrative    Past Psychiatric History: Hospitalizations:  Outpatient Care:  Substance Abuse Care:  Self-Mutilation:  Suicidal Attempts:  Violent Behaviors:   Risk to Self: Is patient at risk for suicide?: No Risk to Others:   Prior Inpatient Therapy:   Prior Outpatient Therapy:    Level of Care:  OP  Hospital Course:    [name] is a 19 year old male with new onset psychosis admitted for bizarre behavior and auditory hallucinations in the context of substance use.  1. Psychosis. The patient was admitted floridly psychotic hallucinating and disorganized he was offered Abilify but refused any medications. The following morning the patient denied any hallucinations and he did not appear to attend to internal stimuli. His thinking was clear. He was able to participate in an interview and call denies conversation.   2. Agitated behavior. Yesterday when told that he would not be discharged immediately the patient became angry and agitated. This has resolved. He is cool and collected.  3. Smoking. NicoDerm patch is available but the patient refuses.  4. Substance use. The patient admits to smoking marijuana but he is negative for substances of admission. He is not interested in substance abuse treatment  5. Disposition. He he was discharged back home with his grandmother. Appointment was made  for him at [facility] but the patient already refuses any follow-up. I placed multiple calls to his grandmother and his aunt but was unable to reach them.   Consults:  None  Significant Diagnostic Studies:  None  Discharge Vitals:   Blood pressure 113/69, pulse 64, temperature 98 F (36.7 C), temperature source Oral, resp. rate 18, height  (1.676 m), weight 66.906 kg (147 lb 8 oz), SpO2 100 %. Body mass index is 23.82 kg/(m^2). Lab Results:   Results for orders placed or performed during the hospital encounter of 03/20/15 (from the past 72 hour(s))  Hemoglobin A1c     Status: None   Collection Time: 03/21/15  8:15 AM  Result Value Ref Range   Hgb A1c MFr Bld 5.1 4.0 - 6.0 %  Lipid panel, fasting     Status: None   Collection Time: 03/21/15  8:15 AM  Result Value Ref Range   Cholesterol 150 0 - 200 mg/dL   Triglycerides 213 <086 mg/dL   HDL 45 >57 mg/dL  Total CHOL/HDL Ratio 3.3 RATIO   VLDL 28 0 - 40 mg/dL   LDL Cholesterol 77 0 - 99 mg/dL    Comment:        Total Cholesterol/HDL:CHD Risk Coronary Heart Disease Risk Table                     Men   Women  1/2 Average Risk   3.4   3.3  Average Risk       5.0   4.4  2 X Average Risk   9.6   7.1  3 X Average Risk  23.4   11.0        Use the calculated Patient Ratio above and the CHD Risk Table to determine the patient's CHD Risk.        ATP III CLASSIFICATION (LDL):  <100     mg/dL   Optimal  [id number]  mg/dL   Near or Above                    Optimal  130-159  mg/dL   Borderline  [id number]  mg/dL   High  >811>190     mg/dL   Very High   TSH     Status: Abnormal   Collection Time: 03/21/15  8:15 AM  Result Value Ref Range   TSH 4.932 (H) 0.350 - 4.500 uIU/mL    Physical Findings: AIMS: Facial and Oral Movements Muscles of Facial Expression: None, normal Lips and Perioral Area: None, normal Jaw: None, normal Tongue: None, normal,Extremity Movements Upper (arms, wrists, hands, fingers): None, normal Lower (legs, knees,  ankles, toes): None, normal, Trunk Movements Neck, shoulders, hips: None, normal, Overall Severity Severity of abnormal movements (highest score from questions above): None, normal Incapacitation due to abnormal movements: None, normal Patient's awareness of abnormal movements (rate only patient's report): No Awareness, Dental Status Current problems with teeth and/or dentures?: No Does patient usually wear dentures?: No  CIWA:  CIWA-Ar Total: 0 COWS:      See Psychiatric Specialty Exam and Suicide Risk Assessment completed by Attending Physician prior to discharge.  Discharge destination:  Home  Is patient on multiple antipsychotic therapies at discharge:  No   Has Patient had three or more failed trials of antipsychotic monotherapy by history:  No    Recommended Plan for Multiple Antipsychotic Therapies: NA  Discharge Instructions    Diet - low sodium heart healthy    Complete by:  As directed      Increase activity slowly    Complete by:  As directed             Medication List    Notice    You have not been prescribed any medications.       Follow-up recommendations:  Activity:  As tolerated. Diet:  Regular. Other:  Keep follow-up appointments.  Comments:    Total Discharge Time: 35 min.  Signed: Jolanta Pucilowska 03/22/2015, 1:01 PM

## 2015-03-22 NOTE — [facility] Suicide Risk Assessment (Signed)
[facility]   Demographic Factors:  Male, Adolescent or young adult and Unemployed  Total Time spent with patient: 30 minutes  Musculoskeletal: Strength & Muscle Tone: within normal limits Gait & Station: normal Patient leans: N/A  Psychiatric Specialty Exam: Physical Exam  Nursing note and vitals reviewed.   Review of Systems  All other systems reviewed and are negative.   Blood pressure 113/69, pulse 64, temperature 98 F (36.7 C), temperature source Oral, resp. rate 18, height 5\' 6"  (1.676 m), weight 66.906 kg (147 lb 8 oz), SpO2 100 %.Body mass index is 23.82 kg/(m^2).  General Appearance: Casual  Eye Contact::  Good  Speech:  Clear and Coherent409  Volume:  Normal  Mood:  Euthymic  Affect:  Appropriate  Thought Process:  Goal Directed  Orientation:  Full (Time, Place, and Person)  Thought Content:  WDL  Suicidal Thoughts:  No  Homicidal Thoughts:  No  Memory:  Immediate;   Fair Recent;   Fair Remote;   Fair  Judgement:  Poor  Insight:  Shallow  Psychomotor Activity:  Normal  Concentration:  Fair  Recall:  FiservFair  Fund of Knowledge:Fair  Language: Fair  Akathisia:  No  Handed:  Right  AIMS (if indicated):     Assets:  Communication Skills Desire for Improvement Housing Physical Health Resilience Social Support  Sleep:  Number of Hours: 5.15  Cognition: WNL  ADL's:  Intact   Have you used any form of tobacco in the last 30 days? (Cigarettes, Smokeless Tobacco, Cigars, and/or Pipes): Yes  Has this patient used any form of tobacco in the last 30 days? (Cigarettes, Smokeless Tobacco, Cigars, and/or Pipes) Yes, A prescription for an FDA-approved tobacco cessation medication was offered at discharge and the patient refused  Mental Status Per Nursing Assessment::   On Admission:  NA  Current Mental Status by Physician: NA  Loss Factors: Financial problems/change in socioeconomic status  Historical Factors: Impulsivity  Risk  Reduction Factors:   Sense of responsibility to family, Living with another person, especially a relative and Positive social support  Continued Clinical Symptoms:  Schizophrenia:   Less than 431 years old Paranoid or undifferentiated type  Cognitive Features That Contribute To Risk:  None    Suicide Risk:  Minimal: No identifiable suicidal ideation.  Patients presenting with no risk factors but with morbid ruminations; may be classified as minimal risk based on the severity of the depressive symptoms  Principal Problem: Schizophreniform disorder [facility](HCC) Discharge Diagnoses:  Patient Active Problem List   Diagnosis Date Noted  . Tobacco use disorder [F17.200] 03/21/2015  . 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 Cannabis use disorder, moderate, dependence (HCC) [F12.20] 03/20/2015      Plan Of Care/Follow-up recommendations:  Activity:  As tolerated. Diet:  Regular. Other:  Keep follow-up appointments.  Is patient on multiple antipsychotic therapies at discharge:  No   Has Patient had three or more failed trials of antipsychotic monotherapy by history:  No  Recommended Plan for Multiple Antipsychotic Therapies: NA    Ala Kratz 03/22/2015, 12:56 PM

## 2015-03-22 NOTE — B[facility] Suicide Risk Assessment (Signed)
[facility] INPATIENT:  Family/Significant Other Suicide Prevention Education  Suicide Prevention Education:  Contact Attempts: Gardner CandleMary Sees (grandmother) [phone] has been identified by the patient as the family member/significant other with whom the patient will be residing, and identified as the person(s) who will aid the patient in the event of a mental health crisis.  With written consent from the patient, two attempts were made to provide suicide prevention education, prior to and/or following the patient's discharge.  We were unsuccessful in providing suicide prevention education.  A suicide education pamphlet was given to the patient to share with family/significant other.  Date and time of first attempt:03/22/15 at 2:05 PM Date and time of second attempt:03/22/15 at 2:34 PM  Lulu RidingIngle, Jersee Winiarski T, MSW, LCSWA 03/22/2015, 2:05 PM

## 2015-03-22 NOTE — B[facility] Group Notes (Signed)
[facility] Group Notes:  (Nursing/MHT/Case Management/Adjunct)  Date:  03/22/2015  Time:  1:38 PM  Type of Therapy:  Psychoeducational Skills  Participation Level:  Active  Participation Quality:  Appropriate  Affect:  Appropriate and Defensive  Cognitive:  Alert and Appropriate  Insight:  Good  Engagement in Group:  Resistant  Modes of Intervention:  Discussion, Education and Support  Summary of Progress/Problems:  Dylan BussingMarly S Urban Roy 03/22/2015, 1:38 PM

## 2015-03-22 NOTE — Progress Notes (Signed)
  [facility] :  Will you be returning to the same living situation after discharge:  Yes,  home with grandmother At discharge, do you have transportation home?: No. LINK ticket provided Do you have the ability to pay for your medications: No. Patient has been refusing meds and is refusing Medication Management referral  Release of information consent forms completed and in the chart;  Patient's signature needed at discharge.  Patient to Follow up at: Follow-up Information    Follow up with [facility] Go on 03/27/2015.   Why:  For follow-up care appt Monday 03/27/15 at 9:00am (walk in appts are M-F 9/4)   Contact information:   [address] [place], [place] Ph [phone] Fax [phone]      Next level [facility] care provider [facility] Link:no  Patient denies SI/HI: Yes,  patient denies SI/HI    Safety Planning and Suicide Prevention discussed: Yes,  SPE discussed with patient and attempted to provide SPE to paitent's grandmother but no answer  Have you used any form of tobacco in the last 30 days? (Cigarettes, Smokeless Tobacco, Cigars, and/or Pipes): Yes  Has patient been referred to the Quitline?: Patient refused referral  Lulu Ridingngle, Dahir Ayer T, MSW, LCSWA 03/22/2015, 2:36 PM

## 2015-03-22 NOTE — Plan of Care (Signed)
Problem: Ineffective individual coping Goal: LTG: Patient will report a decrease in negative feelings Outcome: Progressing Pt is progressing on this goal, pt is able to report feelings and has not reported any negative feeling.

## 2015-03-22 NOTE — Progress Notes (Signed)
D: pt aware of discharge this shift, pt denies suicidal ideation or homicidal ideation, pt calm and cooperative, no distress noted  A: all personal items in locker returned to patient, instructions given on discharge information, received prescriptions and follow up appointment  R: patient states he will comply with outpatient services and medications as prescribed.  Patient left via shuttle to bus stop

## 2015-03-22 NOTE — Progress Notes (Signed)
Pt has been pacing the hall all evening, pt unable to answer questions from the writer. Pt continues to request for a discharge. He refused his HS medication, pt is observed laughing inappropriately and responding to internal stimuli. No concerns voiced, will continue to monitor for safety.

## 2015-03-22 NOTE — BHH Counselor (Signed)
Adult Comprehensive Assessment  Patient ID: Dylan Roy, male   DOB: 1995/12/16, 19 y.o.   MRN: [id number]  Information Source: Information source: Patient  Current Stressors:  Educational / Learning stressors: None reported Employment / Job issues: None reported Family Relationships: None reported  Surveyor, quantityinancial / Lack of resources (include bankruptcy): Pt. is financially dependent on grandmother Housing / Lack of housing: Pt. resides with grandmother Physical health (include injuries & life threatening diseases): None reported Social relationships: None reported Substance abuse: Pt. reports occasional marijuana use Bereavement / Loss: None reported  Living/Environment/Situation:  Living Arrangements: Other relatives (Grandmother) Living conditions (as described by patient or guardian): "Decent" How long has patient lived in current situation?: Pt. was raised by his grandmother What is atmosphere in current home: Comfortable  Family History:  Marital status: Single Does patient have children?: No  Childhood History:  By whom was/is the patient raised?: Grandparents Description of patient's relationship with caregiver when they were a child: Pt. described his relationship with Caregiver as "fair". Patient's description of current relationship with people who raised him/her:  Pt. reported "we have our issues but not too many" Does patient have siblings?: Yes (An older sister) Number of Siblings: 1 Description of patient's current relationship with siblings: "Fair. I don't see her much since [place] in [place]" Did patient suffer any verbal/emotional/physical/sexual abuse as a child?: No Did patient suffer from severe childhood neglect?: No Has patient ever been sexually abused/assaulted/raped as an adolescent or adult?: No Was the patient ever a victim of a crime or a disaster?: No Witnessed domestic violence?: No Has patient been effected by domestic violence as an adult?:  No  Education:  Highest grade of school patient has completed: 11th Currently a student?: No Name of school: N/A  Employment/Work Situation:   Employment situation: Unemployed Patient's job has been impacted by current illness: No What is the longest time patient has a held a job?: N/A Where was the patient employed at that time?: N/A Has patient ever been in the Eli Lilly and Companymilitary?: No Has patient ever served in Buyer, retailcombat?: No  Financial Resources:      Alcohol/Substance Abuse:   What has been your use of drugs/alcohol within the last 12 months?: Pt. reported occasional marijuana use (1 blunt monthly) noting last use was 1 month ago. He shared that his first time using marijuana was at 19 years of age.  If attempted suicide, did drugs/alcohol play a role in this?: No Alcohol/Substance Abuse Treatment Hx: Denies past history If yes, describe treatment: N/A Has alcohol/substance abuse ever caused legal problems?: No  Social Support System:   Patient's Community Support System: None Describe Community Support System: Patient's sole source of social support is through grandmother. Pt. reported that he does not need support because he is of age.  Type of faith/religion: Unable to answer How does patient's faith help to cope with current illness?: N/A  Leisure/Recreation:      Strengths/Needs:      Discharge Plan:   Does patient have access to transportation?: No Plan for no access to transportation at discharge: Pt. requested assistance with public transportation.  Will patient be returning to same living situation after discharge?: Yes (Patient will return to grandmother's residence.) Currently receiving community mental health services: No If no, would patient like referral for services when discharged?: No Does patient have financial barriers related to discharge medications?: No (Patient reported that he does not need any medication because "I'm in my right  mind")  Summary/Recommendations:  Patient is a 19 yo male who was involuntarily committed for exhibiting bizarre behaviors. He presented with an anxious mood, cooperative affect, and focused cognition. Patient reported that he was admitted because his grandmother "thinks I'm losing my mind"; however, stated, "I'm in my right mind. I don't understand why I'm here." He reported occasional marijuana use (1 blunt monthly) disclosing last date of use was one month ago. Patient denied using any other substances. Patient is unemployed and is financially dependent on his grandmother, whom with he resides. Patient denied any referrals to outpatient mental health services and refused to sign consent to contact family. Patient became increasingly paranoid toward end of assessment demanding to be discharged. CSW Intern recommendations include; crisis stabilization, medication management, therapeutic milieu, and encourage group attendance and participation.  Jenel Lucks, Clinical Social Work Intern 03/22/2015   Beryl Meager, MSW, Theresia Majors   03/22/2015

## 2015-08-06 ENCOUNTER — Encounter: Payer: Self-pay | Admitting: Emergency Medicine

## 2015-08-06 ENCOUNTER — Emergency Department
Admission: EM | Admit: 2015-08-06 | Discharge: 2015-08-06 | Disposition: A | Discharge status: Home / Self Care | Payer: Self-pay | Attending: Emergency Medicine | Admitting: Emergency Medicine

## 2015-08-06 DIAGNOSIS — M26601 Right temporomandibular joint disorder, unspecified: Secondary | ICD-10-CM | POA: Insufficient documentation

## 2015-08-06 DIAGNOSIS — M26609 Unspecified temporomandibular joint disorder, unspecified side: Secondary | ICD-10-CM

## 2015-08-06 DIAGNOSIS — F17200 Nicotine dependence, unspecified, uncomplicated: Secondary | ICD-10-CM | POA: Insufficient documentation

## 2015-08-06 MED ORDER — NAPROXEN 500 MG PO TABS: 500.0000 mg | ORAL_TABLET | Freq: Two times a day (BID) | ORAL | Status: DC

## 2015-08-06 NOTE — ED Provider Notes (Signed)
[facility] Emergency Department Provider Note  ____________________________________________  Time seen: Approximately 7:51 PM  I have reviewed the triage vital signs and the nursing notes.   HISTORY  Chief Complaint Jaw Pain    HPI Dylan Roy is a 20 y.o. male who presents emergency department complaining of right-sided jaw pain. Patient states that his hurt when chewing or opening closing his mouth. Patient states that this pain is intermittent and comes and goes and has for years. He states that this time the pain is lasted for a week. He denies any trauma to jaw. He denies any headache, visual changes, difficulty breathing or swallowing, ear pain, fevers or chills, chest pain or shortness of breath.    History reviewed. No pertinent past medical history.  Patient Active Problem List   Diagnosis Date Noted  . 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 Schizophreniform disorder (HCC)   . Tobacco use disorder 03/21/2015  . Cannabis use disorder, moderate, dependence (HCC) 03/20/2015    History reviewed. No pertinent past surgical history.  Current Outpatient Rx  Name  Route  Sig  Dispense  Refill  . naproxen (NAPROSYN) 500 MG tablet   Oral   Take 1 tablet (500 mg total) by mouth 2 (two) times daily with a meal.   60 tablet   0     Allergies Review of patient's allergies indicates no known allergies.  History reviewed. No pertinent family history.  Social History Social History  Substance Use Topics  . Smoking status: Current Every Day Smoker  . Smokeless tobacco: None  . Alcohol Use: Yes     Review of Systems  Constitutional: No fever/chills Eyes: No visual changes. No discharge ENT: No sore throat.Denies dental pain. Cardiovascular: no chest pain. Respiratory: no cough. No SOB. Gastrointestinal: No abdominal pain.  No nausea, no vomiting.   Musculoskeletal: Negative for back pain.  Positive for right-sided jaw pain. Skin: Negative for rash. Neurological: Negative for headaches, focal weakness or numbness. 10-point ROS otherwise negative.  ____________________________________________   PHYSICAL EXAM:  VITAL SIGNS: ED Triage Vitals  Enc Vitals Group     BP 08/06/15 1847 133/65 mmHg     Pulse Rate 08/06/15 1846 75     Resp 08/06/15 1846 16     Temp 08/06/15 1846 98.5 F (36.9 C)     Temp Source 08/06/15 1846 Oral     SpO2 08/06/15 1846 100 %     Weight 08/06/15 1846 158 lb (71.668 kg)     Height 08/06/15 1846 5\' 6"  (1.676 m)     Head Cir --      Peak Flow --      Pain Score 08/06/15 1846 7     Pain Loc --      Pain Edu? --      Excl. in GC? --      Constitutional: Alert and oriented. Well appearing and in no acute distress. Eyes: Conjunctivae are normal. PERRL. EOMI. Head: Atraumatic. ENT:      Ears: EACs and TMs are unremarkable bilaterally.      Nose: No congestion/rhinnorhea.      Mouth/Throat: Mucous membranes are moist. Dentition is intact with no erythema or edema noted intraorally. Neck: No stridor.   Hematological/Lymphatic/Immunilogical: No cervical lymphadenopathy. Cardiovascular: Normal rate, regular rhythm. Normal S1 and S2.  Good peripheral circulation. Respiratory: Normal respiratory effort without tachypnea or retractions. Lungs CTAB. Musculoskeletal: Full range of motion to the mandible. Full range of motion does  not elicit pain. Palpation of the TMJ joint reveals no abnormalities. No crepitus noted. No tenderness to palpation. With opening and closing popping sensation is palpated on patient's right side. Neurologic:  Normal speech and language. No gross focal neurologic deficits are appreciated.  Skin:  Skin is warm, dry and intact. No rash noted. Psychiatric: Mood and affect are normal. Speech and behavior are normal. Patient exhibits appropriate insight and judgement.   ____________________________________________   LABS (all  labs ordered are listed, but only abnormal results are displayed)  Labs Reviewed - No data to display ____________________________________________  EKG   ____________________________________________  RADIOLOGY   No results found.  ____________________________________________    PROCEDURES  Procedure(s) performed:       Medications - No data to display   ____________________________________________   INITIAL IMPRESSION / ASSESSMENT AND PLAN / ED COURSE  Pertinent labs & imaging results that were available during my care of the patient were reviewed by me and considered in my medical decision making (see chart for details).  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 Upon initial interview patient is both chewing gum and rapping to music. Provider tried to start interview and patient held up index finger to "shush" provider until he could finish rapping to his song on his phone. Exam is reassuring. Patient will be discharged home with prescriptions for anti-inflammatories. Patient is to follow up with primary care provider if symptoms persist past this treatment course. Patient is given ED precautions to return to the ED for any worsening or new symptoms.     ____________________________________________  FINAL CLINICAL IMPRESSION(S) / ED DIAGNOSES  Final diagnoses:  TMJ (temporomandibular joint syndrome)      NEW MEDICATIONS STARTED DURING THIS VISIT:  New Prescriptions   NAPROXEN (NAPROSYN) 500 MG TABLET    Take 1 tablet (500 mg total) by mouth 2 (two) times daily with a meal.        This chart was dictated using voice recognition software/Dragon. Despite best efforts to proofread, errors can occur which can change the meaning. Any change was purely unintentional.    Racheal Patches, PA-C 08/06/15 1959  Jennye Moccasin, MD 08/06/15 2025

## 2015-08-06 NOTE — Discharge Instructions (Signed)

## 2015-08-06 NOTE — ED Notes (Signed)
Patient complains of jaw pain that started a week ago. Denies injury, pain has not changed since onset 1 week ago.  He states it is worse when he opens his mouth up wide and when he is chewing.  Rates his discomfort a 10/10.

## 2015-08-06 NOTE — ED Notes (Signed)
Pain to right jaw that is worse when chewing and opening/closing mouth. No fevers.

## 2016-07-07 ENCOUNTER — Encounter (HOSPITAL_COMMUNITY): Payer: Self-pay | Admitting: Emergency Medicine

## 2016-07-07 ENCOUNTER — Emergency Department (HOSPITAL_COMMUNITY)
Admission: EM | Admit: 2016-07-07 | Discharge: 2016-07-07 | Disposition: A | Discharge status: Home / Self Care | Payer: Self-pay | Attending: Emergency Medicine | Admitting: Emergency Medicine

## 2016-07-07 DIAGNOSIS — F17200 Nicotine dependence, unspecified, uncomplicated: Secondary | ICD-10-CM | POA: Insufficient documentation

## 2016-07-07 DIAGNOSIS — K0889 Other specified disorders of teeth and supporting structures: Secondary | ICD-10-CM | POA: Insufficient documentation

## 2016-07-07 NOTE — ED Provider Notes (Signed)
[facility]-EMERGENCY DEPT Provider Note   CSN: [id number] Arrival date & time: 07/07/16  1802  By signing my name below, I, Doreatha Martin, attest that this documentation has been prepared under the direction and in the presence of Roxy Horseman, PA-C. Electronically Signed: Doreatha Martin, ED Scribe. 07/07/16. 6:36 PM.    History   Chief Complaint Chief Complaint  Patient presents with  . Dental Pain    HPI Dylan Roy is a 21 y.o. male who presents to the Emergency Department complaining of moderate, gradually worsening left lower dental pain that began a few days ago and worsened this morning. He attributes his pain to his wisdom tooth that has been growing for the last year, but had not had the tooth evaluated by a dentist previously. Pt states his pain is worsened with mouth movement. No alleviating factors noted. He denies fever, additional complaints.    The history is provided by the patient. No language interpreter was used.    History reviewed. No pertinent past medical history.  Patient Active Problem List   Diagnosis Date Noted  . 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 Schizophreniform disorder (HCC)   . Tobacco use disorder 03/21/2015  . Cannabis use disorder, moderate, dependence (HCC) 03/20/2015    History reviewed. No pertinent surgical history.     Home Medications    Prior to Admission medications   Medication Sig Start Date End Date Taking? Authorizing Provider  naproxen (NAPROSYN) 500 MG tablet Take 1 tablet (500 mg total) by mouth 2 (two) times daily with a meal. 08/06/15   Delorise Royals Cuthriell, PA-C    Family History History reviewed. No pertinent family history.  Social History Social History  Substance Use Topics  . Smoking status: Current Every Day Smoker  . Smokeless tobacco: Not on file  . Alcohol use Yes     Allergies   Patient has no known allergies.   Review of Systems Review of Systems    Constitutional: Negative for fever.  HENT: Positive for dental problem.      Physical Exam Updated Vital Signs BP 138/92 (BP Location: Left Arm)   Pulse 70   Temp 98.6 F (37 C) (Oral)   Resp 16   Ht 5\' 6"  (1.676 m)   Wt 163 lb (73.9 kg)   SpO2 98%   BMI 26.31 kg/m   Physical Exam Physical Exam  Constitutional: Pt appears well-developed and well-nourished.  HENT:  Head: Normocephalic.  Right Ear: Tympanic membrane, external ear and ear canal normal.  Left Ear: Tympanic membrane, external ear and ear canal normal.  Nose: Nose normal. Right sinus exhibits no maxillary sinus tenderness and no frontal sinus tenderness. Left sinus exhibits no maxillary sinus tenderness and no frontal sinus tenderness.  Mouth/Throat: Uvula is midline, oropharynx is clear and moist and mucous membranes are normal. No oral lesions. No uvula swelling or lacerations. No oropharyngeal exudate, posterior oropharyngeal edema, posterior oropharyngeal erythema or tonsillar abscesses.  No gingival swelling, fluctuance or induration No gross abscess  No sublingual edema, tenderness to palpation, or sign of Ludwig's angina, or deep space infection Pain at left lower rear molar/wisdom tooth, small visible portion appears to be impacted Eyes: Conjunctivae are normal. Pupils are equal, round, and reactive to light. Right eye exhibits no discharge. Left eye exhibits no discharge.  Neck: Normal range of motion. Neck supple.  No stridor Handling secretions without difficulty No nuchal rigidity No cervical lymphadenopathy Cardiovascular: Normal rate, regular rhythm and normal heart sounds.  Pulmonary/Chest: Effort normal. No respiratory distress.  Equal chest rise  Abdominal: Soft. Bowel sounds are normal. Pt exhibits no distension. There is no tenderness.  Lymphadenopathy: Pt has no cervical adenopathy.  Neurological: Pt is alert and oriented x 4  Skin: Skin is warm and dry.  Psychiatric: Pt has a normal mood  and affect.  Nursing note and vitals reviewed.    ED Treatments / Results   DIAGNOSTIC STUDIES: Oxygen Saturation is 98% on RA, normal by my interpretation.    COORDINATION OF CARE: 6:34 PM Discussed treatment plan with pt at bedside which includes dental referral and pt agreed to plan.  Labs (all labs ordered are listed, but only abnormal results are displayed) Labs Reviewed - No data to display  EKG  EKG Interpretation None       Radiology No results found.  Procedures Procedures (including critical care time)  Medications Ordered in ED Medications - No data to display   Initial Impression / Assessment and Plan / ED Course  I have reviewed the triage vital signs and the nursing notes.  Pertinent labs & imaging results that were available during my care of the patient were reviewed by me and considered in my medical decision making (see chart for details).     Patient with dentalgia. Appears to be impacted wisdom tooth. On exam, there is no evidence of a drainable abscess. No trismus, glossal elevation, unilateral tonsillar swelling. No evidence of retropharyngeal or peritonsillar abscess or Ludwig angina. Pt instructed to follow-up with dentist. Resource guide provided with AVS. Discussed return precautions. Pt safe for discharge. Pt is agreeable to plan.     Final Clinical Impressions(s) / ED Diagnoses   Final diagnoses:  Pain, dental    New Prescriptions Discharge Medication List as of 07/07/2016  6:37 PM      I personally performed the services described in this documentation, which was scribed in my presence. The recorded information has been reviewed and is accurate.      Roxy Horsemanobert Yitzchok Carriger, PA-C 07/07/16 1844    Cathren LaineKevin Steinl, MD 07/07/16 [phone]

## 2016-07-07 NOTE — Discharge Instructions (Signed)
You need to see a dentist or an oral surgeon to have your wisdom teeth removed.  This procedure is not done in the ER.

## 2016-07-07 NOTE — ED Triage Notes (Signed)
Pt here with left sided dental pain 

## 2016-07-07 NOTE — ED Notes (Signed)
Declined W/C at D/C and was escorted to lobby by RN. 

## 2017-07-14 ENCOUNTER — Encounter (HOSPITAL_COMMUNITY): Payer: Self-pay | Admitting: Emergency Medicine

## 2017-07-14 ENCOUNTER — Other Ambulatory Visit: Payer: Self-pay

## 2017-07-14 DIAGNOSIS — Z79899 Other long term (current) drug therapy: Secondary | ICD-10-CM | POA: Insufficient documentation

## 2017-07-14 DIAGNOSIS — F17200 Nicotine dependence, unspecified, uncomplicated: Secondary | ICD-10-CM | POA: Insufficient documentation

## 2017-07-14 DIAGNOSIS — R112 Nausea with vomiting, unspecified: Secondary | ICD-10-CM | POA: Insufficient documentation

## 2017-07-14 DIAGNOSIS — R197 Diarrhea, unspecified: Secondary | ICD-10-CM | POA: Insufficient documentation

## 2017-07-14 LAB — LIPASE, BLOOD: LIPASE: 30 U/L (ref 11–51)

## 2017-07-14 LAB — COMPREHENSIVE METABOLIC PANEL
ALT: 66 U/L — ABNORMAL HIGH (ref 17–63)
AST: 54 U/L — ABNORMAL HIGH (ref 15–41)
Albumin: 4.5 g/dL (ref 3.5–5.0)
Alkaline Phosphatase: 67 U/L (ref 38–126)
Anion gap: 13 (ref 5–15)
BUN: 13 mg/dL (ref 6–20)
CHLORIDE: 105 mmol/L (ref 101–111)
CO2: 25 mmol/L (ref 22–32)
Calcium: 9.2 mg/dL (ref 8.9–10.3)
Creatinine, Ser: 1.01 mg/dL (ref 0.61–1.24)
GFR calc non Af Amer: >60 mL/min (ref >60)
Glucose, Bld: 96 mg/dL (ref 65–99)
POTASSIUM: 4.5 mmol/L (ref 3.5–5.1)
Sodium: 143 mmol/L (ref 135–145)
TOTAL PROTEIN: 7.3 g/dL (ref 6.5–8.1)
Total Bilirubin: 0.6 mg/dL (ref 0.3–1.2)

## 2017-07-14 LAB — URINALYSIS, ROUTINE W REFLEX MICROSCOPIC
Bilirubin Urine: NEGATIVE
GLUCOSE, UA: NEGATIVE mg/dL
Hgb urine dipstick: NEGATIVE
Ketones, ur: NEGATIVE mg/dL
LEUKOCYTES UA: NEGATIVE
NITRITE: NEGATIVE
PROTEIN: NEGATIVE mg/dL
Specific Gravity, Urine: 1.024 (ref 1.005–1.030)
pH: 6 (ref 5.0–8.0)

## 2017-07-14 LAB — CBC
HEMATOCRIT: 46.7 % (ref 39.0–52.0)
Hemoglobin: 15.5 g/dL (ref 13.0–17.0)
MCH: 28.5 pg (ref 26.0–34.0)
MCHC: 33.2 g/dL (ref 30.0–36.0)
MCV: 86 fL (ref 78.0–100.0)
PLATELETS: 233 10*3/uL (ref 150–400)
RBC: 5.43 MIL/uL (ref 4.22–5.81)
RDW: 13.9 % (ref 11.5–15.5)
WBC: 11.5 10*3/uL — ABNORMAL HIGH (ref 4.0–10.5)

## 2017-07-14 MED ORDER — ACETAMINOPHEN 325 MG PO TABS
650.0000 mg | ORAL_TABLET | Freq: Once | ORAL | Status: AC
  Administered 2017-07-14: 650 mg via ORAL
  Filled 2017-07-14: qty 2

## 2017-07-14 NOTE — ED Triage Notes (Signed)
Pt to ED with generalized abd pain with nausea and vomiting.  Onset earlier today

## 2017-07-15 ENCOUNTER — Emergency Department (HOSPITAL_COMMUNITY)
Admission: EM | Admit: 2017-07-15 | Discharge: 2017-07-15 | Disposition: A | Discharge status: Home / Self Care | Payer: Self-pay | Attending: Emergency Medicine | Admitting: Emergency Medicine

## 2017-07-15 DIAGNOSIS — R197 Diarrhea, unspecified: Secondary | ICD-10-CM

## 2017-07-15 DIAGNOSIS — R112 Nausea with vomiting, unspecified: Secondary | ICD-10-CM

## 2017-07-15 MED ORDER — ONDANSETRON 4 MG PO TBDP
4.0000 mg | ORAL_TABLET | Freq: Once | ORAL | Status: AC
  Administered 2017-07-15: 4 mg via ORAL
  Filled 2017-07-15: qty 1

## 2017-07-15 MED ORDER — ONDANSETRON 4 MG PO TBDP: 4.0000 mg | ORAL_TABLET | Freq: Three times a day (TID) | ORAL | 0 refills | Status: DC | PRN

## 2017-07-15 NOTE — Discharge Instructions (Signed)
You were seen today for nausea and vomiting.  This is likely an early virus.  Make sure to stay hydrated.

## 2017-07-15 NOTE — ED Provider Notes (Signed)
[facility] EMERGENCY [facility]PART[facility] Provider Note   CSN: [id number] Arrival date & time: 07/14/17  2151     History   Chief Complaint Chief Complaint  Patient presents with  . Abdominal Pain  . Emesis    HPI Dylan Roy is a 22 y.o. male.  HPI  This is a 22 year old male who presents with abdominal pain and vomiting.  Patient reports onset of symptoms after getting off work yesterday.  He reports crampy diffuse abdominal pain and 2-3 episodes of nonbilious, nonbloody emesis.  Symptoms have since resolved.  He is currently pain-free.  He denies any loose or diarrheal stools.  Denies any fever.  Temperature was 100.7 upon arrival.  He has had several sick contacts.  Denies congestion, sore throat, chest pain, shortness of breath.  History reviewed. No pertinent past medical history.  Patient Active Problem List   Diagnosis Date Noted  . 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 Schizophreniform disorder (HCC)   . Tobacco use disorder 03/21/2015  . Cannabis use disorder, moderate, dependence (HCC) 03/20/2015    History reviewed. No pertinent surgical history.     Home Medications    Prior to Admission medications   Medication Sig Start Date End Date Taking? Authorizing Provider  naproxen (NAPROSYN) 500 MG tablet Take 1 tablet (500 mg total) by mouth 2 (two) times daily with a meal. 08/06/15   Cuthriell, Christiane HaJonathan D, PA-C  ondansetron (ZOFRAN ODT) 4 MG disintegrating tablet Take 1 tablet (4 mg total) by mouth every 8 (eight) hours as needed for nausea or vomiting. 07/15/17   Sabriel Borromeo, Mayer Maskerourtney F, MD    Family History No family history on file.  Social History Social History   Tobacco Use  . Smoking status: Current Every Day Smoker  . Smokeless tobacco: Never Used  Substance Use Topics  . Alcohol use: Yes  . Drug use: Yes    Types: Marijuana     Allergies   Patient has no known allergies.   Review of  Systems Review of Systems  Constitutional: Positive for fever. Negative for chills.  HENT: Negative for congestion and sore throat.   Respiratory: Negative for shortness of breath.   Cardiovascular: Negative for chest pain.  Gastrointestinal: Positive for abdominal pain, nausea and vomiting. Negative for diarrhea.  Genitourinary: Negative for dysuria.  All other systems reviewed and are negative.    Physical Exam Updated Vital Signs BP 129/64   Pulse (!) 104   Temp (!) 100.7 F (38.2 C)   Resp 20   Ht 5\' 6"  (1.676 m)   Wt 72.6 kg (160 lb)   SpO2 94%   BMI 25.82 kg/m   Physical Exam  Constitutional: He is oriented to person, place, and time. He appears well-developed and well-nourished. He does not appear ill.  HENT:  Head: Normocephalic and atraumatic.  Neck: Neck supple.  Cardiovascular: Normal rate, regular rhythm and normal heart sounds.  No murmur heard. Pulmonary/Chest: Effort normal and breath sounds normal. No respiratory distress. He has no wheezes.  Abdominal: Soft. Bowel sounds are normal. There is no tenderness. There is no rebound.  Musculoskeletal: He exhibits no edema.  Lymphadenopathy:    He has no cervical adenopathy.  Neurological: He is alert and oriented to person, place, and time.  Skin: Skin is warm and dry.  Psychiatric: He has a normal mood and affect.  Nursing note and vitals reviewed.    ED Treatments / Results  Labs (all labs ordered are  listed, but only abnormal results are displayed) Labs Reviewed  COMPREHENSIVE METABOLIC PANEL - Abnormal; Notable for the following components:      Result Value   AST 54 (*)    ALT 66 (*)    All other components within normal limits  CBC - Abnormal; Notable for the following components:   WBC 11.5 (*)    All other components within normal limits  LIPASE, BLOOD  URINALYSIS, ROUTINE W REFLEX MICROSCOPIC    EKG  EKG Interpretation None       Radiology No results  found.  Procedures Procedures (including critical care time)  Medications Ordered in ED Medications  acetaminophen (TYLENOL) tablet 650 mg (650 mg Oral Given 07/14/17 2215)  ondansetron (ZOFRAN-ODT) disintegrating tablet 4 mg (4 mg Oral Given 07/15/17 0315)     Initial Impression / Assessment and Plan / ED Course  I have reviewed the triage vital signs and the nursing notes.  Pertinent labs & imaging results that were available during my care of the patient were reviewed by me and considered in my medical decision making (see chart for details).     Patient presents with vomiting and abdominal pain.  Symptoms have resolved.  He states he is back to his baseline.  Vital signs notable for a temperature of 100.7 upon arrival and heart rate of 104.  His exam is benign.  No reproducible tenderness.  Lab workup is largely reassuring.  He appears hydrated.  Patient was given Zofran and allowed to orally hydrate.  Suspect viral etiology given benign exam.  Recommend supportive measures at home.  After history, exam, and medical workup I feel the patient has been appropriately medically screened and is safe for discharge home. Pertinent diagnoses were discussed with the patient. Patient was given return precautions.   Final Clinical Impressions(s) / ED Diagnoses   Final diagnoses:  Nausea vomiting and diarrhea    ED Discharge Orders        Ordered    ondansetron (ZOFRAN ODT) 4 MG disintegrating tablet  Every 8 hours PRN     07/15/17 0404       Shon Baton, MD 07/15/17 [phone]

## 2017-09-05 ENCOUNTER — Other Ambulatory Visit: Payer: Self-pay

## 2017-09-05 ENCOUNTER — Emergency Department (HOSPITAL_COMMUNITY)
Admission: EM | Admit: 2017-09-05 | Discharge: 2017-09-05 | Disposition: A | Discharge status: Home / Self Care | Payer: Self-pay | Attending: Emergency Medicine | Admitting: Emergency Medicine

## 2017-09-05 ENCOUNTER — Encounter (HOSPITAL_COMMUNITY): Payer: Self-pay | Admitting: *Deleted

## 2017-09-05 DIAGNOSIS — R112 Nausea with vomiting, unspecified: Secondary | ICD-10-CM | POA: Insufficient documentation

## 2017-09-05 DIAGNOSIS — Z5321 Procedure and treatment not carried out due to patient leaving prior to being seen by health care provider: Secondary | ICD-10-CM | POA: Insufficient documentation

## 2017-09-05 LAB — COMPREHENSIVE METABOLIC PANEL
ALT: 23 U/L (ref 17–63)
AST: 22 U/L (ref 15–41)
Albumin: 4 g/dL (ref 3.5–5.0)
Alkaline Phosphatase: 55 U/L (ref 38–126)
Anion gap: 10 (ref 5–15)
BILIRUBIN TOTAL: 0.8 mg/dL (ref 0.3–1.2)
BUN: 8 mg/dL (ref 6–20)
CHLORIDE: 106 mmol/L (ref 101–111)
CO2: 25 mmol/L (ref 22–32)
CREATININE: 0.92 mg/dL (ref 0.61–1.24)
Calcium: 9.2 mg/dL (ref 8.9–10.3)
GFR calc non Af Amer: >60 mL/min (ref >60)
Glucose, Bld: 84 mg/dL (ref 65–99)
POTASSIUM: 3.8 mmol/L (ref 3.5–5.1)
Sodium: 141 mmol/L (ref 135–145)
TOTAL PROTEIN: 6.9 g/dL (ref 6.5–8.1)

## 2017-09-05 LAB — CBC
HCT: 41 % (ref 39.0–52.0)
Hemoglobin: 13.5 g/dL (ref 13.0–17.0)
MCH: 28.1 pg (ref 26.0–34.0)
MCHC: 32.9 g/dL (ref 30.0–36.0)
MCV: 85.4 fL (ref 78.0–100.0)
PLATELETS: 241 10*3/uL (ref 150–400)
RBC: 4.8 MIL/uL (ref 4.22–5.81)
RDW: 13.9 % (ref 11.5–15.5)
WBC: 4.5 10*3/uL (ref 4.0–10.5)

## 2017-09-05 LAB — LIPASE, BLOOD: LIPASE: 29 U/L (ref 11–51)

## 2017-09-05 MED ORDER — ONDANSETRON 4 MG PO TBDP
4.0000 mg | ORAL_TABLET | Freq: Once | ORAL | Status: AC
  Administered 2017-09-05: 4 mg via ORAL
  Filled 2017-09-05: qty 1

## 2017-09-05 NOTE — ED Triage Notes (Signed)
Pt in c/o n/v onset today with x 2 vomiting episodes, denies diarrhea,denies pain, reports coworker has been sick, A&O x4

## 2017-09-05 NOTE — ED Notes (Addendum)
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  Pt. Will return if needed. Pt. Stated, " I am feeling better."   Encouraged pt. To stay.

## 2019-12-31 ENCOUNTER — Emergency Department
Admission: EM | Admit: 2019-12-31 | Discharge: 2019-12-31 | Disposition: A | Discharge status: Home / Self Care | Payer: HRSA Program | Attending: Emergency Medicine | Admitting: Emergency Medicine

## 2019-12-31 ENCOUNTER — Other Ambulatory Visit: Payer: Self-pay

## 2019-12-31 DIAGNOSIS — Z20822 Contact with and (suspected) exposure to covid-19: Secondary | ICD-10-CM

## 2019-12-31 DIAGNOSIS — R0981 Nasal congestion: Secondary | ICD-10-CM | POA: Diagnosis present

## 2019-12-31 DIAGNOSIS — U071 COVID-19: Secondary | ICD-10-CM | POA: Insufficient documentation

## 2019-12-31 DIAGNOSIS — F17210 Nicotine dependence, cigarettes, uncomplicated: Secondary | ICD-10-CM | POA: Diagnosis not present

## 2019-12-31 LAB — SARS CORONAVIRUS 2 BY RT PCR (HOSPITAL ORDER, PERFORMED IN ~~LOC~~ HOSPITAL LAB): SARS Coronavirus 2: POSITIVE — AB

## 2019-12-31 NOTE — ED Triage Notes (Signed)
Pt states is here for covid testing. Pt states had a recent covid contact. Pt complains of nasal congestion and cough.

## 2019-12-31 NOTE — ED Notes (Signed)
Signature pad not working, pt verbalizes understanding of d/c instructions, denies questions or concerns at this time  

## 2019-12-31 NOTE — ED Notes (Signed)
See triage note, pt states he is here for a "COVID test 2nd opinion". States he took at home COVID test that resulted positive today. Reports cough, headache since Wednesday.  NAD noted, ambulatory to treatment room.  Denies SHOB or CP

## 2019-12-31 NOTE — ED Provider Notes (Signed)
[facility] Emergency Department Provider Note   ____________________________________________   First MD Initiated Contact with Patient 12/31/19 2209     (approximate)  I have reviewed the triage vital signs and the nursing notes.   HISTORY  Chief Complaint covid testing    HPI Dylan Roy is a 24 y.o. male who presents to the emergency department for Covid testing.  The patient states that he took over-the-counter test from [facility] 2 hours ago which resulted positive.  He states he is here for a second opinion on that test.  He had a Covid exposure from a friend 4 days ago.  He is complaining of nasal congestion with no other associated symptoms.  He denies shortness of breath, chest pain, loss of smell or taste, GI symptoms.  Patient states he also wants a note for work.        No past medical history on file.  Patient Active Problem List   Diagnosis Date Noted  . 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 Schizophreniform disorder (HCC)   . Tobacco use disorder 03/21/2015  . Cannabis use disorder, moderate, dependence (HCC) 03/20/2015    No past surgical history on file.  Prior to Admission medications   Medication Sig Start Date End Date Taking? Authorizing Provider  naproxen (NAPROSYN) 500 MG tablet Take 1 tablet (500 mg total) by mouth 2 (two) times daily with a meal. 08/06/15   Cuthriell, Christiane Ha D, PA-C  ondansetron (ZOFRAN ODT) 4 MG disintegrating tablet Take 1 tablet (4 mg total) by mouth every 8 (eight) hours as needed for nausea or vomiting. 07/15/17   Horton, Mayer Masker, MD    Allergies Patient has no known allergies.  No family history on file.  Social History Social History   Tobacco Use  . Smoking status: Current Every Day Smoker    Packs/day: 0.25    Types: Cigarettes  . Smokeless tobacco: Never Used  Substance Use Topics  . Alcohol use: Yes    Comment: occassional  . Drug use:  Yes    Types: Marijuana    Review of Systems  Constitutional: No fever/chills Eyes: No visual changes. ENT: + Nasal congestion, no sore throat. Cardiovascular: Denies chest pain. Respiratory: Denies shortness of breath. Gastrointestinal: No abdominal pain.  No nausea, no vomiting.  No diarrhea.  No constipation. Genitourinary: Negative for dysuria. Musculoskeletal: Negative for back pain. Skin: Negative for rash. Neurological: Negative for headaches, focal weakness or numbness.   ____________________________________________   PHYSICAL EXAM:  VITAL SIGNS: ED Triage Vitals  Enc Vitals Group     BP 12/31/19 2047 133/85     Pulse Rate 12/31/19 2047 (!) 101     Resp 12/31/19 2047 16     Temp 12/31/19 2047 98 F (36.7 C)     Temp Source 12/31/19 2047 Oral     SpO2 12/31/19 2047 100 %     Weight 12/31/19 2048 195 lb (88.5 kg)     Height 12/31/19 2048 5\' 7"  (1.702 m)     Head Circumference --      Peak Flow --      Pain Score 12/31/19 2048 2     Pain Loc --      Pain Edu? --      Excl. in GC? --     Constitutional: Alert and oriented. Well appearing and in no acute distress. Eyes: Conjunctivae are normal. PERRL. EOMI. Head: Atraumatic. Nose: + Rhinorrhea Mouth/Throat: Mucous membranes are moist.  Oropharynx non-erythematous. Neck: No stridor.   Cardiovascular: Normal rate, regular rhythm. Grossly normal heart sounds.  Good peripheral circulation. Respiratory: Normal respiratory effort.  No retractions. Lungs CTAB. Gastrointestinal: Soft and nontender. No distention. No abdominal bruits. No CVA tenderness. Musculoskeletal: No lower extremity tenderness nor edema.  No joint effusions. Neurologic:  Normal speech and language. No gross focal neurologic deficits are appreciated. No gait instability. Skin:  Skin is warm, dry and intact. No rash noted. Psychiatric: Mood and affect are normal. Speech and behavior are normal.  ____________________________________________     LABS (all labs ordered are listed, but only abnormal results are displayed)  Labs Reviewed  SARS CORONAVIRUS 2 BY RT PCR (HOSPITAL ORDER, PERFORMED IN [place] [facility])    ____________________________________________   INITIAL IMPRESSION / ASSESSMENT AND PLAN / ED COURSE  As part of my medical decision making, I reviewed the following data within the electronic MEDICAL RECORD NUMBER Nursing notes reviewed and incorporated        Dylan Roy is a 24 year old male who reports to the emergency department for Covid testing and for a work note.  The patient denies all symptoms except for nasal congestion.  Positive Covid exposure 4 days ago, not vaccinated.  Exam is reassuring with normal lung and heart sounds, and stable vital signs.  The patient was swabbed for Covid, but discharged before the Covid results were available.  A nurse will call him if he is positive.  He was also given a work note to stay home for 10 days if he test positive for Covid.  Dylan Roy was evaluated in Emergency Department on 12/31/2019 for the symptoms described in the history of present illness. He was evaluated in the context of the global COVID-19 pandemic, which necessitated consideration that the patient might be at risk for infection with the SARS-CoV-2 virus that causes COVID-19. Institutional protocols and algorithms that pertain to the evaluation of patients at risk for COVID-19 are in a state of rapid change based on information released by regulatory bodies including the CDC and federal and state organizations. These policies and algorithms were followed during the patient's care in the ED.       ____________________________________________   FINAL CLINICAL IMPRESSION(S) / ED DIAGNOSES  Final diagnoses:  Suspected COVID-19 virus infection     ED Discharge Orders    None       Note:  This document was prepared using Dragon voice recognition software and may include  unintentional dictation errors.    Lucy Chris, PA 12/31/19 2340    Shaune Pollack, MD 01/05/20 [phone]

## 2020-01-01 NOTE — ED Notes (Signed)
01/01/20 01:00 patient notified of + Covid results

## 2020-01-01 NOTE — ED Notes (Signed)
Attempted call patient to notify patient of Covid + results. Voice mail not set up at this time.

## 2021-10-21 ENCOUNTER — Encounter (HOSPITAL_COMMUNITY): Payer: Self-pay | Admitting: Emergency Medicine

## 2021-10-21 ENCOUNTER — Other Ambulatory Visit: Payer: Self-pay

## 2021-10-21 ENCOUNTER — Emergency Department (HOSPITAL_COMMUNITY)
Admission: EM | Admit: 2021-10-21 | Discharge: 2021-10-21 | Disposition: A | Discharge status: Home / Self Care | Payer: Self-pay | Attending: Emergency Medicine | Admitting: Emergency Medicine

## 2021-10-21 DIAGNOSIS — S0181XA Laceration without foreign body of other part of head, initial encounter: Secondary | ICD-10-CM | POA: Insufficient documentation

## 2021-10-21 DIAGNOSIS — W25XXXA Contact with sharp glass, initial encounter: Secondary | ICD-10-CM | POA: Insufficient documentation

## 2021-10-21 DIAGNOSIS — Z23 Encounter for immunization: Secondary | ICD-10-CM | POA: Insufficient documentation

## 2021-10-21 DIAGNOSIS — S01511A Laceration without foreign body of lip, initial encounter: Secondary | ICD-10-CM | POA: Insufficient documentation

## 2021-10-21 MED ORDER — TETANUS-DIPHTH-ACELL PERTUSSIS 5-2.5-18.5 LF-MCG/0.5 IM SUSY
0.5000 mL | PREFILLED_SYRINGE | Freq: Once | INTRAMUSCULAR | Status: AC
  Administered 2021-10-21: 0.5 mL via INTRAMUSCULAR
  Filled 2021-10-21: qty 0.5

## 2021-10-21 NOTE — Discharge Instructions (Signed)
You had a laceration of your right lower lip as well as a laceration of your right side of the lower part of your face.  Laceration that is mostly inside her mouth will heal well on its own.  You can rinse with the solution that I prescribed you.  This will help keep the area clean.   The laceration today that was repaired with glue will heal well on its own.  Do not scrub this area or gently clean it for the next 2 or 3 days.  The Dermabond/skin glue will fall off on its own.  Once it does you can use a little Aquaphor, petroleum jelly, Vaseline or other similar product to keep the area moist.  Return to the emergency room for any new or concerning symptoms such as redness swelling or pus.  You were updated on tetanus today.  This vaccination date should be kept track of.  Please use Tylenol or ibuprofen for pain.  You may use 600 mg ibuprofen every 6 hours or 1000 mg of Tylenol every 6 hours.  You may choose to alternate between the 2.  This would be most effective.  Not to exceed 4 g of Tylenol within 24 hours.  Not to exceed 3200 mg ibuprofen 24 hours.

## 2021-10-21 NOTE — ED Triage Notes (Signed)
Pt reportedto ED with  c/o laceration to right lower lip and right chin after "opening beer bottle with mouth and accidentally cutting himself". Laceration to chin covered and no active bleeding noted at time of triage. Pt denies any physical injury to had or face. Denies LOC.

## 2021-10-21 NOTE — ED Provider Notes (Signed)
[facility] EMERGENCY DEPARTMENT Provider Note   CSN: [id number] Arrival date & time: 10/21/21  0040     History  Chief Complaint  Patient presents with   Laceration    Lejuan M Virgil is a 26 y.o. male.   Laceration  Patient is a 26 year old male with no pertinent past medical history presented to the emergency room today with complaints of laceration of his right lip and right side of his chin.  He states that this occurred last night close to midnight.  He states he came to the ER immediately after the incident occurred.  He states that the injury occurred when he was opening a beer bottle with his mouth he states it was a glass beer bottle.  He states that the last time he received a tetanus vaccine was in middle school.  He denies any other injuries.  He states that he was somewhat intoxicated at the time.  He no longer feels intoxicated.  He feels generally well states he is not uncomfortable.    Home Medications Prior to Admission medications   Medication Sig Start Date End Date Taking? Authorizing Provider  naproxen (NAPROSYN) 500 MG tablet Take 1 tablet (500 mg total) by mouth 2 (two) times daily with a meal. 08/06/15   Cuthriell, Roderic Palau D, PA-C  ondansetron (ZOFRAN ODT) 4 MG disintegrating tablet Take 1 tablet (4 mg total) by mouth every 8 (eight) hours as needed for nausea or vomiting. 07/15/17   Horton, Barbette Hair, MD      Allergies    Patient has no known allergies.    Review of Systems   Review of Systems  Physical Exam Updated Vital Signs BP 130/71 (BP Location: Left Arm)   Pulse 71   Temp (!) 97.5 F (36.4 C) (Oral)   Resp 16   SpO2 98%  Physical Exam Vitals and nursing note reviewed.  Constitutional:      General: He is not in acute distress.    Appearance: Normal appearance. He is not ill-appearing.  HENT:     Head: Normocephalic and atraumatic.     Mouth/Throat:     Comments: Right lower lip with small cut that is primarily  inside of the lower lip but does extend just enough anteriorly to be visible with his mouth closed.  Does not cross the vermilion border   Eyes:     General: No scleral icterus.       Right eye: No discharge.        Left eye: No discharge.     Conjunctiva/sclera: Conjunctivae normal.  Pulmonary:     Effort: Pulmonary effort is normal.     Breath sounds: No stridor.  Skin:    Comments: 1 cm somewhat superficial laceration to the right chin does not cross vermilion border is located for 5 cm inferior to lip margin  Neurological:     Mental Status: He is alert and oriented to person, place, and time. Mental status is at baseline.     ED Results / Procedures / Treatments   Labs (all labs ordered are listed, but only abnormal results are displayed) Labs Reviewed - No data to display  EKG None  Radiology No results found.  Procedures .Marland KitchenLaceration Repair  Date/Time: 10/21/2021 9:44 AM  Performed by: Tedd Sias, PA Authorized by: Tedd Sias, PA   Consent:    Consent obtained:  Verbal   Consent given by:  Patient   Risks discussed:  Infection, need for  additional repair, pain, poor cosmetic result and poor wound healing   Alternatives discussed:  No treatment and delayed treatment Universal protocol:    Procedure explained and questions answered to patient or proxy's satisfaction: yes     Relevant documents present and verified: yes     Test results available: yes     Imaging studies available: yes     Required blood products, implants, devices, and special equipment available: yes     Site/side marked: yes     Immediately prior to procedure, a time out was called: yes     Patient identity confirmed:  Verbally with patient Anesthesia:    Anesthesia method:  None Laceration details:    Location:  Face   Face location:  Chin   Length (cm):  1 Exploration:    Hemostasis achieved with:  Direct pressure   Wound exploration: wound explored through full range of  motion     Wound extent: no foreign bodies/material noted and no tendon damage noted     Contaminated: no   Treatment:    Area cleansed with:  Saline   Amount of cleaning:  Standard   Irrigation solution:  Sterile saline   Irrigation volume:  60 cc   Irrigation method:  Pressure wash   Visualized foreign bodies/material removed: no   Skin repair:    Repair method:  Tissue adhesive Approximation:    Approximation:  Close Repair type:    Repair type:  Simple Post-procedure details:    Procedure completion:  Tolerated well, no immediate complications     Medications Ordered in ED Medications  Tdap (BOOSTRIX) injection 0.5 mL (0.5 mLs Intramuscular Given 10/21/21 0916)    ED Course/ Medical Decision Making/ A&P                           Medical Decision Making  Patient is a 26 year old male with no pertinent past medical history presented to the emergency room today with complaints of laceration of his right lip and right side of his chin.  He states that this occurred last night close to midnight.  He states he came to the ER immediately after the incident occurred.  He states that the injury occurred when he was opening a beer bottle with his mouth he states it was a glass beer bottle.  He states that the last time he received a tetanus vaccine was in middle school.  He denies any other injuries.  He states that he was somewhat intoxicated at the time.  He no longer feels intoxicated.  He feels generally well states he is not uncomfortable.  No laceration that crosses the vermilion border.  He does have a right side chin laceration.  This was repaired with Dermabond.  We had a shared decision conversation about using Dermabond.  He agrees to this he states that he is not concerned about cosmesis primarily.  Tetanus updated today.  No imaging necessary for this patient.  Wound care instructions provided.   Final Clinical Impression(s) / ED Diagnoses Final diagnoses:  Facial  laceration, initial encounter  Lip laceration, initial encounter    Rx / DC Orders ED Discharge Orders     None         Tedd Sias, [place] 10/21/21 [id number]    Regan Lemming, MD 10/21/21 1550

## 2024-01-13 ENCOUNTER — Emergency Department: Payer: Self-pay

## 2024-01-13 ENCOUNTER — Other Ambulatory Visit: Payer: Self-pay

## 2024-01-13 ENCOUNTER — Emergency Department
Admission: EM | Admit: 2024-01-13 | Discharge: 2024-01-13 | Disposition: A | Discharge status: Home / Self Care | Payer: Self-pay | Source: Ambulatory Visit | Attending: Emergency Medicine | Admitting: Emergency Medicine

## 2024-01-13 DIAGNOSIS — S6991XA Unspecified injury of right wrist, hand and finger(s), initial encounter: Secondary | ICD-10-CM | POA: Insufficient documentation

## 2024-01-13 DIAGNOSIS — W500XXA Accidental hit or strike by another person, initial encounter: Secondary | ICD-10-CM | POA: Insufficient documentation

## 2024-01-13 NOTE — ED Triage Notes (Signed)
 Pt comes in via pov with right index finger pain that started about a month ago. 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 Pt states that the finger popped out of place, and he popped it back into place, and put a splint on it. Pt states that the finger isn't getting better. Pt complains of pain 10/10 with contact, but no pain without touch. Pt with swelling to the right ring finger, but is able to move finger.

## 2024-01-13 NOTE — ED Provider Notes (Signed)
 [facility] Provider Note    Event Date/Time   First MD Initiated Contact with Patient 01/13/24 1154     (approximate)   History   Finger Injury   HPI  Dylan Roy is a 28 y.o. male who presents emergency department for evaluation of right fourth finger pain.  Patient reports that approximately 1 month ago he went to grab a resident and his finger collided with the resident and patient felt like his finger popped out of place as in the DIP, and he put it back into place.  However he reports that his finger is not getting better.  He reports that he has pain with trying to flex extend his finger though he is able to do so.  He has had swelling ever since.  No open wounds.  No other injuries.  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   Schizophreniform disorder (HCC)    Tobacco use disorder 03/21/2015   Cannabis use disorder, moderate, dependence (HCC) 03/20/2015          Physical Exam   Triage Vital Signs: ED Triage Vitals  Encounter Vitals Group     BP 01/13/24 1131 (!) 146/109     Girls Systolic BP Percentile --      Girls Diastolic BP Percentile --      Boys Systolic BP Percentile --      Boys Diastolic BP Percentile --      Pulse Rate 01/13/24 1131 70     Resp 01/13/24 1131 17     Temp 01/13/24 1131 98.2 F (36.8 C)     Temp src --      SpO2 01/13/24 1131 98 %     Weight 01/13/24 1132 190 lb (86.2 kg)     Height 01/13/24 1132 5' 6 (1.676 m)     Head Circumference --      Peak Flow --      Pain Score 01/13/24 1132 10     Pain Loc --      Pain Education --      Exclude from Growth Chart --     Most recent vital signs: Vitals:   01/13/24 1131  BP: (!) 146/109  Pulse: 70  Resp: 17  Temp: 98.2 F (36.8 C)  SpO2: 98%    Physical Exam Vitals and nursing note reviewed.  Constitutional:      General: Awake and alert. No acute distress.     Appearance: Normal appearance. The patient is normal weight.  HENT:     Head: Normocephalic and atraumatic.     Mouth: Mucous membranes are moist.  Eyes:     General: PERRL. Normal EOMs        Right eye: No discharge.        Left eye: No discharge.     Conjunctiva/sclera: Conjunctivae normal.  Cardiovascular:     Rate and Rhythm: Normal rate and regular rhythm.     Pulses: Normal pulses.  Pulmonary:     Effort: Pulmonary effort is normal. No respiratory distress.     Breath sounds: Normal breath sounds.  Abdominal:     Abdomen is soft. There is no abdominal tenderness. No rebound or guarding. No distention. Musculoskeletal:        General: No swelling. Normal range of motion.     Cervical back: Normal range of motion and neck supple.  Right hand: Faint swelling noted to  the fourth finger specifically at the PIP and the DIP.  Patient is able to flex and extend at isolated MCP, PIP, and DIP against resistance.  There is no erythema, ecchymosis, or open wounds noted.  No nail abnormalities. Skin:    General: Skin is warm and dry.     Capillary Refill: Capillary refill takes less than 2 seconds.     Findings: No rash.  Neurological:     Mental Status: The patient is awake and alert.      ED Results / Procedures / Treatments   Labs (all labs ordered are listed, but only abnormal results are displayed) Labs Reviewed - No data to display   EKG     RADIOLOGY I independently reviewed and interpreted imaging and agree with radiologists findings.     PROCEDURES:  Critical Care performed:   Procedures   MEDICATIONS ORDERED IN ED: Medications - No data to display   IMPRESSION / MDM / ASSESSMENT AND PLAN / ED COURSE  I reviewed the triage vital signs and the nursing notes.   Differential diagnosis includes, but is not limited to, tendon/ligament injury, fracture, dislocation.  Patient is awake and alert, hemodynamically stable and afebrile.  He is nontoxic in  appearance.  He does have swelling noted at the PIP and the DIP, however he is able to range these joints against resistance.  I am wondering if when he reduced his finger a piece of soft tissue or tendon became stuck in the joint space.  X-ray obtained does not reveal any acute abnormalities.  I recommended that he follow-up with orthopedic surgery and the appropriate follow-up information was provided.  He was put in the splint for protection.  We discussed return precautions in the meantime.  Patient understands and agrees with plan.  He was discharged in stable condition.   Patient's presentation is most consistent with acute complicated illness / injury requiring diagnostic workup.     FINAL CLINICAL IMPRESSION(S) / ED DIAGNOSES   Final diagnoses:  Injury of finger of right hand, initial encounter     Rx / DC Orders   ED Discharge Orders     None        Note:  This document was prepared using Dragon voice recognition software and may include unintentional dictation errors.   Vernal Rutan E, PA-C 01/13/24 1402    Jacolyn Pae, MD 01/13/24 [phone]

## 2024-01-13 NOTE — Discharge Instructions (Addendum)
 Please follow-up with orthopedic surgery regarding your finger.  Please return for any new, worsening, or change in symptoms or other concerns.  It was a pleasure caring for you today.
